# Patient Record
Sex: Female | Born: 1975 | Hispanic: Yes | Marital: Married | Smoking: Never smoker
Health system: Southern US, Community
[De-identification: ages and names within clinical notes are randomized; demographics above are authoritative.]

## PROBLEM LIST (undated history)

## (undated) HISTORY — PX: COSMETIC SURGERY: SHX468

## (undated) HISTORY — PX: REFRACTIVE SURGERY: SHX103

---

## 2004-04-21 HISTORY — PX: REFRACTIVE SURGERY: SHX103

## 2009-04-21 HISTORY — PX: CHOLECYSTECTOMY: SHX55

## 2018-04-21 HISTORY — PX: COSMETIC SURGERY: SHX468

## 2020-11-08 ENCOUNTER — Other Ambulatory Visit: Payer: Self-pay

## 2020-11-08 ENCOUNTER — Ambulatory Visit (LOCAL_COMMUNITY_HEALTH_CENTER): Payer: BLUE CROSS/BLUE SHIELD

## 2020-11-08 DIAGNOSIS — Z23 Encounter for immunization: Secondary | ICD-10-CM

## 2020-11-08 DIAGNOSIS — Z0184 Encounter for antibody response examination: Secondary | ICD-10-CM

## 2020-11-08 NOTE — Progress Notes (Signed)
In Nurse Clinic for vaccines needed for immigration. Pt states she moved from [place] 1 yr ago and resides in [place] but is in [place] for the summer. Presents childhood vaccine record from [place]. Reports hx chickenpox. Pt agrees to have Varicella titer ($23).  Tdap and Twinrix administered today. Pt plans to wait until varicella titer results are ready before having MMR vaccine so that if varicella vaccine is indicated she may have both varicella and MMR vaccines on same day since both are live vaccines. ROI signed and RN plans to call pt with varicella titer results when ready. Pt sent to lab for titer and clerk for payment. Josie Saunders, RN

## 2020-11-10 LAB — VARICELLA ZOSTER ANTIBODY, IGG: Varicella zoster IgG: 2094 index (ref >165)

## 2020-11-12 ENCOUNTER — Ambulatory Visit (LOCAL_COMMUNITY_HEALTH_CENTER): Payer: BLUE CROSS/BLUE SHIELD

## 2020-11-12 ENCOUNTER — Other Ambulatory Visit: Payer: Self-pay

## 2020-11-12 DIAGNOSIS — Z23 Encounter for immunization: Secondary | ICD-10-CM

## 2020-11-12 NOTE — Progress Notes (Signed)
In Nurse Clinic for MMR as needed for immigration. Tolerated  MMR vaccine well today. Copy of Varicella titer results (consistent with immunity) given and explained. [facility] updated and copy given and explained. Josie Saunders, RN

## 2020-11-23 DIAGNOSIS — M545 Low back pain, unspecified: Secondary | ICD-10-CM | POA: Insufficient documentation

## 2020-11-23 HISTORY — DX: Low back pain, unspecified: M54.50

## 2020-11-25 ENCOUNTER — Other Ambulatory Visit: Payer: Self-pay

## 2020-11-25 ENCOUNTER — Emergency Department: Payer: BLUE CROSS/BLUE SHIELD

## 2020-11-25 ENCOUNTER — Emergency Department
Admission: EM | Admit: 2020-11-25 | Discharge: 2020-11-25 | Disposition: A | Discharge status: Home / Self Care | Payer: BLUE CROSS/BLUE SHIELD | Attending: Emergency Medicine | Admitting: Emergency Medicine

## 2020-11-25 ENCOUNTER — Encounter: Payer: Self-pay | Admitting: Emergency Medicine

## 2020-11-25 DIAGNOSIS — M545 Low back pain, unspecified: Secondary | ICD-10-CM | POA: Diagnosis not present

## 2020-11-25 LAB — POC URINE PREG, ED: Preg Test, Ur: NEGATIVE

## 2020-11-25 MED ORDER — CYCLOBENZAPRINE HCL 5 MG PO TABS: 5.0000 mg | ORAL_TABLET | Freq: Three times a day (TID) | ORAL | 0 refills | Status: DC | PRN

## 2020-11-25 MED ORDER — NAPROXEN 500 MG PO TABS
500.0000 mg | ORAL_TABLET | Freq: Once | ORAL | Status: AC
  Administered 2020-11-25: 500 mg via ORAL
  Filled 2020-11-25: qty 1

## 2020-11-25 MED ORDER — NAPROXEN 375 MG PO TABS: 375.0000 mg | ORAL_TABLET | Freq: Two times a day (BID) | ORAL | 0 refills | Status: DC

## 2020-11-25 MED ORDER — CYCLOBENZAPRINE HCL 10 MG PO TABS
5.0000 mg | ORAL_TABLET | Freq: Once | ORAL | Status: AC
  Administered 2020-11-25: 5 mg via ORAL
  Filled 2020-11-25: qty 1

## 2020-11-25 NOTE — ED Notes (Signed)
Pt ambulatory to restroom with steady gait to obtain urine specimen.

## 2020-11-25 NOTE — ED Notes (Signed)
Pt to ED POV c/o R sided lower back pain. Pt said it started 2 days ago and has gotten worse. Denies abdominal pain, N&V, urinary symptoms,and fever. Pt had one episode of diarrhea.   A&O x4, NAD at this time

## 2020-11-25 NOTE — Discharge Instructions (Addendum)
You were seen today for acute low back pain.  Your x-ray did not show any acute findings.  You likely have a lumbar strain.  I have given you prescription for anti-inflammatories to take twice daily with food.  I have given you prescription for muscle relaxers to take every 8 hours as needed for muscle tension, please be aware that these may cause sedation.  You may alternate heat and ice OTC.  I have given you back exercises to try.  You may follow-up with a chiropractor if symptoms persist or worsen.

## 2020-11-25 NOTE — ED Triage Notes (Signed)
Pt reports lower back pain that started last pm and radiates through her right buttocks and down her right leg

## 2020-11-25 NOTE — ED Provider Notes (Signed)
[facility] Emergency Department Provider Note ____________________________________________  Time seen: 1930  I have reviewed the triage vital signs and the nursing notes.  HISTORY  Chief Complaint  Back Pain   HPI Alexis Hardin is a 45 y.o. female presents to the ER today with complaint of low back pain.  She reports this started 2 days ago.  She describes the pain as sharp and stabbing.  The pain radiates into her right buttock.  She denies pain that radiates down her right leg.  She denies loss of bowel or bladder control.  She denies numbness, tingling or weakness of her right lower extremity.  She denies nausea, vomiting pain, reflux, constipation, diarrhea or blood in her stool.  She denies urinary urgency, frequency, dysuria, blood in her urine or vaginal complaints at this time.  She has taken Ibuprofen and Tylenol OTC with minimal relief of symptoms.  She denies any injury to the area.  History reviewed. No pertinent past medical history.  There are no problems to display for this patient.   History reviewed. No pertinent surgical history.  Prior to Admission medications   Medication Sig Start Date End Date Taking? Authorizing Provider  cyclobenzaprine (FLEXERIL) 5 MG tablet Take 1 tablet (5 mg total) by mouth 3 (three) times daily as needed for muscle spasms. 11/25/20  Yes Lorre Munroe, NP  naproxen (NAPROSYN) 375 MG tablet Take 1 tablet (375 mg total) by mouth 2 (two) times daily with a meal. 11/25/20  Yes Tkeya Stencil, Salvadore Oxford, NP    Allergies Patient has no known allergies.  No family history on file.  Social History    Review of Systems  Constitutional: Negative for fever, chills or body aches. Cardiovascular: Negative for chest pain or chest tightness. Respiratory: Negative for cough or shortness of breath. Gastrointestinal: Negative for abdominal pain, nausea, vomiting, constipation, diarrhea or blood in her stool. Genitourinary:  Negative for urinary urgency, frequency, dysuria, blood in her urine or vaginal complaints.. Musculoskeletal: Positive for low back pain. Skin: Negative for rash. Neurological: Negative for focal weakness, tingling or numbness. ____________________________________________  PHYSICAL EXAM:  VITAL SIGNS: ED Triage Vitals  Enc Vitals Group     BP 11/25/20 1755 92/76     Pulse Rate 11/25/20 1755 66     Resp 11/25/20 1755 18     Temp 11/25/20 1755 98.4 F (36.9 C)     Temp Source 11/25/20 1755 Oral     SpO2 11/25/20 1755 100 %     Weight 11/25/20 1745 120 lb (54.4 kg)     Height 11/25/20 1745 5' (1.524 m)     Head Circumference --      Peak Flow --      Pain Score 11/25/20 1745 9     Pain Loc --      Pain Edu? --      Excl. in GC? --     Constitutional: Alert and oriented. Well appearing and in no distress. Head: Normocephalic. Eyes: Normal extraocular movements Cardiovascular: Normal rate, regular rhythm.  Respiratory: Normal respiratory effort. No wheezes/rales/rhonchi. Gastrointestinal: No CVA tenderness noted. Musculoskeletal: Normal flexion, extension, rotation and lateral bending of the spine.  No bony tenderness noted over the lumbar spine.  Pain with palpation of the right paralumbar muscles.  Strength 5/5 BLE.  Able to stand on tiptoes and heels.  Gait slow and steady without device. Neurologic:  Normal gait without ataxia. Normal speech and language. No gross focal neurologic deficits are appreciated.  Skin:  Skin is warm, dry and intact. No rash noted.  ____________________________________________   LABS Labs Reviewed  POC URINE PREG, ED   ____________________________________________   RADIOLOGY Imaging Orders  DG Lumbar Spine 2-3 Views  IMPRESSION: Mild dextrocurvature.  Otherwise negative  ____________________________________________   INITIAL IMPRESSION / ASSESSMENT AND PLAN / ED COURSE  Acute Low Back Pain:  DDx include lumbar strain, acute low back  pain with right sided sciatica, osteoarthritis Xray lumbar spine does not show any acute findings Naproxen 500 mg PO x 1 Flexeril 5 mg PO x 1 RX for Naproxen 375 mg BID x 7 days RX for Flexeril 5 mg TID prn- sedation caution given She plans to follow up with an orthopedist as an outpatient   ___________________________________  FINAL CLINICAL IMPRESSION(S) / ED DIAGNOSES  Final diagnoses:  Acute right-sided low back pain without sciatica      Lorre Munroe, NP 11/25/20 2028    Merwyn Katos, MD 11/25/20 2252

## 2021-04-21 DIAGNOSIS — K589 Irritable bowel syndrome without diarrhea: Secondary | ICD-10-CM

## 2021-04-21 HISTORY — DX: Irritable bowel syndrome, unspecified: K58.9

## 2021-10-26 DIAGNOSIS — H109 Unspecified conjunctivitis: Secondary | ICD-10-CM

## 2021-10-26 HISTORY — DX: Unspecified conjunctivitis: H10.9

## 2021-10-28 ENCOUNTER — Ambulatory Visit
Admission: RE | Admit: 2021-10-28 | Discharge: 2021-10-28 | Disposition: A | Discharge status: Home / Self Care | Payer: No Typology Code available for payment source | Source: Ambulatory Visit | Attending: Emergency Medicine | Admitting: Emergency Medicine

## 2021-10-28 VITALS — BP 103/67 | HR 72 | Temp 98.0°F | Resp 18

## 2021-10-28 DIAGNOSIS — H109 Unspecified conjunctivitis: Secondary | ICD-10-CM | POA: Diagnosis not present

## 2021-10-28 DIAGNOSIS — H00015 Hordeolum externum left lower eyelid: Secondary | ICD-10-CM | POA: Diagnosis not present

## 2021-10-28 MED ORDER — POLYMYXIN B-TRIMETHOPRIM 10000-0.1 UNIT/ML-% OP SOLN
1.0000 [drp] | Freq: Four times a day (QID) | OPHTHALMIC | 0 refills | Status: AC
Start: 2021-10-28 — End: 2021-11-04

## 2021-10-28 NOTE — ED Triage Notes (Signed)
Patient presents to Urgent Care with complaints of eye itchiness and drainage since Saturday. Pt states she is concerned with eye infection. Not treating with meds.

## 2021-10-28 NOTE — Discharge Instructions (Addendum)
Use the antibiotic eyedrops as prescribed.    Follow-up with your primary care provider if your symptoms are not improving.    Go to the emergency department if you have acute eye pain, changes in your vision, or other concerning symptoms.    

## 2021-10-28 NOTE — ED Provider Notes (Signed)
[facility]    CSN: [id number] Arrival date & time: 10/28/21  1907      History   Chief Complaint Chief Complaint  Patient presents with   Eye Problem    Entered by patient    HPI Alexis Hardin is a 46 y.o. female.  Patient presents with left lower eyelid "bump" that is tender and red x2 days.  She also reports bilateral eye itching, redness, crusting in her lashes, drainage.  No treatments at home.  She denies eye injury, eye pain, changes in vision.  She denies fever, chills, sore throat, cough, shortness of breath, vomiting, diarrhea, or other symptoms.  No pertinent medical history.  The history is provided by the patient.    History reviewed. No pertinent past medical history.  There are no problems to display for this patient.   Past Surgical History:  Procedure Laterality Date   REFRACTIVE SURGERY      OB History   No obstetric history on file.      Home Medications    Prior to Admission medications   Medication Sig Start Date End Date Taking? Authorizing Provider  trimethoprim-polymyxin b (POLYTRIM) ophthalmic solution Place 1 drop into both eyes 4 (four) times daily for 7 days. 10/28/21 11/04/21 Yes Mickie Bail, NP  cyclobenzaprine (FLEXERIL) 5 MG tablet Take 1 tablet (5 mg total) by mouth 3 (three) times daily as needed for muscle spasms. 11/25/20   Lorre Munroe, NP  naproxen (NAPROSYN) 375 MG tablet Take 1 tablet (375 mg total) by mouth 2 (two) times daily with a meal. 11/25/20   Baity, Salvadore Oxford, NP    Family History History reviewed. No pertinent family history.  Social History Social History   Tobacco Use   Smoking status: Never   Smokeless tobacco: Never  Vaping Use   Vaping Use: Never used  Substance Use Topics   Alcohol use: Never   Drug use: Never     Allergies   Patient has no known allergies.   Review of Systems Review of Systems  Constitutional:  Negative for chills and fever.  HENT:  Negative for ear  pain and sore throat.   Eyes:  Positive for discharge, redness and itching. Negative for pain and visual disturbance.  Respiratory:  Negative for cough and shortness of breath.   Gastrointestinal:  Negative for diarrhea and vomiting.  Skin:  Negative for color change and rash.  All other systems reviewed and are negative.    Physical Exam Triage Vital Signs ED Triage Vitals  Enc Vitals Group     BP      Pulse      Resp      Temp      Temp src      SpO2      Weight      Height      Head Circumference      Peak Flow      Pain Score      Pain Loc      Pain Edu?      Excl. in GC?    No data found.  Updated Vital Signs BP 103/67   Pulse 72   Temp 98 F (36.7 C)   Resp 18   SpO2 98%   Visual Acuity Right Eye Distance:   Left Eye Distance:   Bilateral Distance:    Right Eye Near:   Left Eye Near:    Bilateral Near:     Physical  Exam Vitals and nursing note reviewed.  Constitutional:      General: She is not in acute distress.    Appearance: She is well-developed. She is not ill-appearing.  HENT:     Mouth/Throat:     Mouth: Mucous membranes are moist.     Pharynx: Oropharynx is clear.  Eyes:     General: Vision grossly intact.        Right eye: No discharge.        Left eye: No discharge.     Extraocular Movements: Extraocular movements intact.     Conjunctiva/sclera:     Right eye: Right conjunctiva is injected.     Left eye: Left conjunctiva is injected.     Pupils: Pupils are equal, round, and reactive to light.   Cardiovascular:     Rate and Rhythm: Normal rate and regular rhythm.     Heart sounds: Normal heart sounds.  Pulmonary:     Effort: Pulmonary effort is normal. No respiratory distress.     Breath sounds: Normal breath sounds.  Musculoskeletal:     Cervical back: Neck supple.  Skin:    General: Skin is warm and dry.  Neurological:     Mental Status: She is alert.  Psychiatric:        Mood and Affect: Mood normal.        Behavior:  Behavior normal.      UC Treatments / Results  Labs (all labs ordered are listed, but only abnormal results are displayed) Labs Reviewed - No data to display  EKG   Radiology No results found.  Procedures Procedures (including critical care time)  Medications Ordered in UC Medications - No data to display  Initial Impression / Assessment and Plan / UC Course  I have reviewed the triage vital signs and the nursing notes.  Pertinent labs & imaging results that were available during my care of the patient were reviewed by me and considered in my medical decision making (see chart for details).  Conjunctivitis, hordeolum of left lower eyelid.  Discussed symptomatic treatment including warm compresses.  Also treating with Polytrim eyedrops.  ED precautions discussed.  Instructed patient to follow-up with her PCP if her symptoms are not improving.  Education provided on stye.  Patient agrees to plan of care.   Final Clinical Impressions(s) / UC Diagnoses   Final diagnoses:  Conjunctivitis of left eye, unspecified conjunctivitis type  Hordeolum externum of left lower eyelid     Discharge Instructions      Use the antibiotic eyedrops as prescribed.    Follow-up with your primary care provider if your symptoms are not improving.    Go to the emergency department if you have acute eye pain, changes in your vision, or other concerning symptoms.        ED Prescriptions     Medication Sig Dispense Auth. Provider   trimethoprim-polymyxin b (POLYTRIM) ophthalmic solution Place 1 drop into both eyes 4 (four) times daily for 7 days. 10 mL Mickie Bail, NP      PDMP not reviewed this encounter.   Mickie Bail, NP 10/28/21 1947

## 2022-05-02 ENCOUNTER — Ambulatory Visit: Payer: Self-pay

## 2022-05-02 DIAGNOSIS — N390 Urinary tract infection, site not specified: Secondary | ICD-10-CM

## 2022-05-02 HISTORY — DX: Urinary tract infection, site not specified: N39.0

## 2022-05-05 ENCOUNTER — Ambulatory Visit
Admission: EM | Admit: 2022-05-05 | Discharge: 2022-05-05 | Disposition: A | Discharge status: Home / Self Care | Payer: BC Managed Care – PPO | Attending: Physician Assistant | Admitting: Physician Assistant

## 2022-05-05 ENCOUNTER — Other Ambulatory Visit: Payer: Self-pay

## 2022-05-05 DIAGNOSIS — R82998 Other abnormal findings in urine: Secondary | ICD-10-CM | POA: Diagnosis present

## 2022-05-05 LAB — URINALYSIS, ROUTINE W REFLEX MICROSCOPIC
Bilirubin Urine: NEGATIVE
Glucose, UA: NEGATIVE mg/dL
Hgb urine dipstick: NEGATIVE
Ketones, ur: NEGATIVE mg/dL
Nitrite: NEGATIVE
Protein, ur: NEGATIVE mg/dL
Specific Gravity, Urine: 1.025 (ref 1.005–1.030)
pH: 5.5 (ref 5.0–8.0)

## 2022-05-05 LAB — URINALYSIS, MICROSCOPIC (REFLEX)

## 2022-05-05 NOTE — ED Triage Notes (Signed)
Pt states she was having UTI sx last week and used telehealth.  She is taking Metronidazole and Diflucan. Her symptoms are better except she is concerned about the color of her urine. States it is very dark.

## 2022-05-05 NOTE — ED Provider Notes (Signed)
[facility]    CSN: [id number] Arrival date & time: 05/05/22  1259      History   Chief Complaint No chief complaint on file.   HPI Alexis Hardin is a 47 y.o. female.   Patient presents for evaluation of darkened urine, more urgent color than baseline present for the last 7 days.  Currently taking metronidazole and Diflucan for vaginal symptoms which have resolved, prescribed by e-visit.  Denies all urinary symptoms, abdominal pain, flank pain, fevers.  Endorses keto diet.  Endorses adequate fluid intake through primarily water.     History reviewed. No pertinent past medical history.  There are no problems to display for this patient.   Past Surgical History:  Procedure Laterality Date   REFRACTIVE SURGERY      OB History   No obstetric history on file.      Home Medications    Prior to Admission medications   Medication Sig Start Date End Date Taking? Authorizing Provider  fluconazole (DIFLUCAN) 150 MG tablet Take 150 mg by mouth every 3 (three) days. 05/02/22  Yes [provider]  metroNIDAZOLE (FLAGYL) 500 MG tablet Take 500 mg by mouth 2 (two) times daily. 05/02/22  Yes [provider]  cyclobenzaprine (FLEXERIL) 5 MG tablet Take 1 tablet (5 mg total) by mouth 3 (three) times daily as needed for muscle spasms. 11/25/20   Jearld Fenton, NP  naproxen (NAPROSYN) 375 MG tablet Take 1 tablet (375 mg total) by mouth 2 (two) times daily with a meal. 11/25/20   Baity, Coralie Keens, NP    Family History History reviewed. No pertinent family history.  Social History Social History   Tobacco Use   Smoking status: Never   Smokeless tobacco: Never  Vaping Use   Vaping Use: Never used  Substance Use Topics   Alcohol use: Never   Drug use: Never     Allergies   Patient has no known allergies.   Review of Systems Review of Systems  Constitutional: Negative.   Respiratory: Negative.    Cardiovascular: Negative.    Genitourinary: Negative.   Skin: Negative.   Neurological: Negative.      Physical Exam Triage Vital Signs ED Triage Vitals  Enc Vitals Group     BP 05/05/22 1329 (!) 107/56     Pulse Rate 05/05/22 1329 73     Resp 05/05/22 1329 16     Temp 05/05/22 1329 98 F (36.7 C)     Temp Source 05/05/22 1329 Oral     SpO2 05/05/22 1329 100 %     Weight 05/05/22 1325 125 lb 3.2 oz (56.8 kg)     Height 05/05/22 1325 5' (1.524 m)     Head Circumference --      Peak Flow --      Pain Score 05/05/22 1325 0     Pain Loc --      Pain Edu? --      Excl. in [place]? --    No data found.  Updated Vital Signs BP (!) 107/56 (BP Location: Left Arm)   Pulse 73   Temp 98 F (36.7 C) (Oral)   Resp 16   Ht 5' (1.524 m)   Wt 125 lb 3.2 oz (56.8 kg)   LMP 04/17/2022   SpO2 100%   BMI 24.45 kg/m   Visual Acuity Right Eye Distance:   Left Eye Distance:   Bilateral Distance:    Right Eye Near:   Left  Eye Near:    Bilateral Near:     Physical Exam Constitutional:      Appearance: Normal appearance.  HENT:     Head: Normocephalic.  Eyes:     Extraocular Movements: Extraocular movements intact.  Pulmonary:     Effort: Pulmonary effort is normal.  Abdominal:     General: Abdomen is flat. Bowel sounds are normal. There is no distension.     Palpations: Abdomen is soft.     Tenderness: There is no abdominal tenderness. There is no right CVA tenderness, left CVA tenderness or guarding.  Skin:    General: Skin is warm and dry.  Neurological:     Mental Status: She is alert and oriented to person, place, and time. Mental status is at baseline.      UC Treatments / Results  Labs (all labs ordered are listed, but only abnormal results are displayed) Labs Reviewed  URINALYSIS, [place] MICROSCOPIC    EKG   Radiology No results found.  Procedures Procedures (including critical care time)  Medications Ordered in UC Medications - No data to display  Initial Impression  / Assessment and Plan / UC Course  I have reviewed the triage vital signs and the nursing notes.  Pertinent labs & imaging results that were available during my care of the patient were reviewed by me and considered in my medical decision making (see chart for details).  Dark urine  Urinalysis negative, sent for culture as patient is currently taking antibiotic, yeast is present and discussed administration of remaining Diflucan tablets to help clear symptoms, advised to continue increase fluid intake and good hygiene measures, advised to monitor closely and if new urinary symptoms begin to return for reevaluation  Final Clinical Impressions(s) / UC Diagnoses   Final diagnoses:  None   Discharge Instructions   None    ED Prescriptions   None    PDMP not reviewed this encounter.   Hans Eden, NP 05/05/22 1443

## 2022-05-05 NOTE — Discharge Instructions (Addendum)
urinalysis today is negative for infection, it has been sent to culture to determine if bacteria is present in his urine currently taking antibiotic  Able to see yeast in your urine , second Diflucan tablet after completion of your antibiotics  Please continue to increase fluid intake through use of water  If you begin to have urinary symptoms such as frequency, urgency, pain with urination, lower abdominal pain, new back pain please return for reevaluation

## 2022-05-06 ENCOUNTER — Ambulatory Visit: Payer: Self-pay

## 2022-05-06 LAB — URINE CULTURE: Culture: NO GROWTH

## 2022-09-02 ENCOUNTER — Encounter: Payer: Self-pay | Admitting: Nurse Practitioner

## 2022-09-02 ENCOUNTER — Ambulatory Visit (INDEPENDENT_AMBULATORY_CARE_PROVIDER_SITE_OTHER): Payer: BC Managed Care – PPO | Admitting: Nurse Practitioner

## 2022-09-02 VITALS — BP 130/74 | HR 86 | Temp 98.1°F | Resp 16 | Ht 60.0 in | Wt 122.2 lb

## 2022-09-02 DIAGNOSIS — D649 Anemia, unspecified: Secondary | ICD-10-CM | POA: Diagnosis not present

## 2022-09-02 DIAGNOSIS — M533 Sacrococcygeal disorders, not elsewhere classified: Secondary | ICD-10-CM

## 2022-09-02 DIAGNOSIS — L659 Nonscarring hair loss, unspecified: Secondary | ICD-10-CM

## 2022-09-02 DIAGNOSIS — M7918 Myalgia, other site: Secondary | ICD-10-CM

## 2022-09-02 DIAGNOSIS — Z1231 Encounter for screening mammogram for malignant neoplasm of breast: Secondary | ICD-10-CM

## 2022-09-02 DIAGNOSIS — E538 Deficiency of other specified B group vitamins: Secondary | ICD-10-CM | POA: Diagnosis not present

## 2022-09-02 DIAGNOSIS — Z1211 Encounter for screening for malignant neoplasm of colon: Secondary | ICD-10-CM

## 2022-09-02 DIAGNOSIS — E559 Vitamin D deficiency, unspecified: Secondary | ICD-10-CM | POA: Diagnosis not present

## 2022-09-02 DIAGNOSIS — Z0000 Encounter for general adult medical examination without abnormal findings: Secondary | ICD-10-CM

## 2022-09-02 DIAGNOSIS — Z1212 Encounter for screening for malignant neoplasm of rectum: Secondary | ICD-10-CM

## 2022-09-02 NOTE — Progress Notes (Signed)
[facility] [address] [place], [place] [address]  Internal MEDICINE  Office Visit Note  Patient Name: Alexis Hardin  [id number]  [id number]  Date of Service: 09/02/2022   Complaints/HPI Pt is here for establishment of PCP. Chief Complaint  Patient presents with   New Patient (Initial Visit)    General check up/ labs    HPI Neida presents for a new patient visit to establish care.  Well-appearing 47 y.o. female with no major medical conditions. She is not on any medications. 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  Diet: healthy, [employer], weight and BMI are normal .  Exercise: not regularly  Tobacco use: none Alcohol use: none Illicit drug use: none Routine CRC screening: due now, opt for cologuard  Routine mammogram: due now  Pap smear: due now  Labs: due for routine labs  New or worsening pain: intermittent lower back pain -- not sure what from. Wants to get further xrays    Current Medication: Outpatient Encounter Medications as of 09/02/2022  Medication Sig   cyclobenzaprine (FLEXERIL) 5 MG tablet Take 1 tablet (5 mg total) by mouth 3 (three) times daily as needed for muscle spasms. (Patient not taking: Reported on 09/02/2022)   fluconazole (DIFLUCAN) 150 MG tablet Take 150 mg by mouth every 3 (three) days. (Patient not taking: Reported on 09/02/2022)   metroNIDAZOLE (FLAGYL) 500 MG tablet Take 500 mg by mouth 2 (two) times daily. (Patient not taking: Reported on 09/02/2022)   naproxen (NAPROSYN) 375 MG tablet Take 1 tablet (375 mg total) by mouth 2 (two) times daily with a meal. (Patient not taking: Reported on 09/02/2022)   No facility-administered encounter medications on file as of 09/02/2022.    Surgical History: Past Surgical History:  Procedure Laterality Date   COSMETIC SURGERY     REFRACTIVE SURGERY      Medical History: No past medical history on  file.  Family History: No family history on file.  Social History   Socioeconomic History   Marital status: Married    Spouse name: Not on file   Number of children: Not on file   Years of education: Not on file   Highest education level: Not on file  Occupational History   Not on file  Tobacco Use   Smoking status: Never   Smokeless tobacco: Never  Vaping Use   Vaping Use: Never used  Substance and Sexual Activity   Alcohol use: Never   Drug use: Never   Sexual activity: Not Currently  Other Topics Concern   Not on file  Social History Narrative   Not on file   Social Determinants of Health   Financial Resource Strain: Not on file  Food Insecurity: Not on file  Transportation Needs: Not on file  Physical Activity: Not on file  Stress: Not on file  Social Connections: Not on file  Intimate Partner Violence: Not on file     Review of Systems  Constitutional:  Negative for chills, fatigue and unexpected weight change.  HENT:  Negative for congestion, postnasal drip, rhinorrhea, sneezing and sore throat.   Respiratory: Negative.  Negative for cough, chest tightness and shortness of breath.   Cardiovascular: Negative.  Negative for chest pain and palpitations.  Gastrointestinal:  Negative for abdominal pain, constipation, diarrhea, nausea and vomiting.  Musculoskeletal:  Negative for arthralgias, back pain, joint swelling and neck pain.  Skin:  Negative for  rash.  Psychiatric/Behavioral:  Negative for behavioral problems (Depression), sleep disturbance and suicidal ideas. The patient is not nervous/anxious.     Vital Signs: BP 130/74   Pulse 86   Temp 98.1 F (36.7 C)   Resp 16   Ht 5' (1.524 m)   Wt 122 lb 3.2 oz (55.4 kg)   SpO2 99%   BMI 23.87 kg/m    Physical Exam Vitals reviewed.  Constitutional:      General: She is not in acute distress.    Appearance: Normal appearance. She is normal weight. She is not ill-appearing.  HENT:     Head:  Normocephalic and atraumatic.  Eyes:     Pupils: Pupils are equal, round, and reactive to light.  Cardiovascular:     Rate and Rhythm: Normal rate and regular rhythm.  Pulmonary:     Effort: Pulmonary effort is normal. No respiratory distress.  Neurological:     Mental Status: She is alert and oriented to person, place, and time.  Psychiatric:        Mood and Affect: Mood normal.        Behavior: Behavior normal.       Assessment/Plan: 1. Hair loss disorder Routine labs ordered - CBC with Differential/Platelet - CMP14+EGFR - Vitamin D (25 hydroxy) - B12 and Folate Panel - Iron, TIBC and Ferritin Panel - TSH + free T4  2. Anemia, unspecified type Routine labs ordered - CBC with Differential/Platelet - B12 and Folate Panel - Iron, TIBC and Ferritin Panel - TSH + free T4  3. B12 deficiency Routine labs ordered - CBC with Differential/Platelet - B12 and Folate Panel - Iron, TIBC and Ferritin Panel  4. Vitamin D deficiency Routine labs ordered - Vitamin D (25 hydroxy)  5. Pain of muscle of pelvis Xray ordered for further evaluation - DG Pelvis 1-2 Views; Future  6. Pain in the coccyx Xray ordered for further evaluation  - DG Pelvis 1-2 Views; Future  7. Routine health maintenance Routine labs ordered - CBC with Differential/Platelet - CMP14+EGFR - Lipid Profile - Vitamin D (25 hydroxy) - B12 and Folate Panel - Iron, TIBC and Ferritin Panel - TSH + free T4  8. Screening for colorectal cancer Cologuard test ordered - Cologuard  9. Encounter for screening mammogram for malignant neoplasm of breast Routine mammogram ordered - MM 3D SCREENING MAMMOGRAM BILATERAL BREAST; Future     General Counseling: Janeice verbalizes understanding of the findings of todays visit and agrees with plan of treatment. I have discussed any further diagnostic evaluation that may be needed or ordered today. We also reviewed her medications today. she has been encouraged to call  the office with any questions or concerns that should arise related to todays visit.    Orders Placed This Encounter  Procedures   MM 3D SCREENING MAMMOGRAM BILATERAL BREAST   DG Pelvis 1-2 Views   Cologuard   CBC with Differential/Platelet   CMP14+EGFR   Lipid Profile   Vitamin D (25 hydroxy)   B12 and Folate Panel   Iron, TIBC and Ferritin Panel   TSH + free T4    No orders of the defined types were placed in this encounter.   Return for CPE/PAP, Giavonni Fonder PCP at earliest available opening, have labs done prior to visit. .  Time spent:30 Minutes Time spent with patient included reviewing progress notes, labs, imaging studies, and discussing plan for follow up.   [place] Controlled Substance Database was reviewed by me for overdose risk score (  ORS)   This patient was seen by Jonetta Osgood, FNP-C in collaboration with Dr. Clayborn Bigness as a part of collaborative care agreement.   Emmalise Huard R. Valetta Fuller, MSN, FNP-C Internal Medicine

## 2022-09-19 ENCOUNTER — Ambulatory Visit
Admission: RE | Admit: 2022-09-19 | Discharge: 2022-09-19 | Disposition: A | Discharge status: Home / Self Care | Payer: BC Managed Care – PPO | Source: Ambulatory Visit | Attending: Nurse Practitioner | Admitting: Nurse Practitioner

## 2022-09-19 DIAGNOSIS — M7918 Myalgia, other site: Secondary | ICD-10-CM | POA: Diagnosis present

## 2022-09-19 DIAGNOSIS — M533 Sacrococcygeal disorders, not elsewhere classified: Secondary | ICD-10-CM | POA: Diagnosis present

## 2022-09-20 LAB — CMP14+EGFR
ALT: 12 IU/L (ref 0–32)
AST: 14 IU/L (ref 0–40)
Albumin/Globulin Ratio: 1.6 (ref 1.2–2.2)
Albumin: 4.3 g/dL (ref 3.9–4.9)
Alkaline Phosphatase: 55 IU/L (ref 44–121)
BUN/Creatinine Ratio: 22 (ref 9–23)
BUN: 15 mg/dL (ref 6–24)
Bilirubin Total: 0.4 mg/dL (ref 0.0–1.2)
CO2: 20 mmol/L (ref 20–29)
Calcium: 9 mg/dL (ref 8.7–10.2)
Chloride: 105 mmol/L (ref 96–106)
Creatinine, Ser: 0.69 mg/dL (ref 0.57–1.00)
Globulin, Total: 2.7 g/dL (ref 1.5–4.5)
Glucose: 82 mg/dL (ref 70–99)
Potassium: 4.2 mmol/L (ref 3.5–5.2)
Sodium: 138 mmol/L (ref 134–144)
Total Protein: 7 g/dL (ref 6.0–8.5)
eGFR: 109 mL/min/{1.73_m2} (ref >59)

## 2022-09-20 LAB — CBC WITH DIFFERENTIAL/PLATELET
Basophils Absolute: 0.1 10*3/uL (ref 0.0–0.2)
Basos: 1 %
EOS (ABSOLUTE): 0.1 10*3/uL (ref 0.0–0.4)
Eos: 1 %
Hematocrit: 40.3 % (ref 34.0–46.6)
Hemoglobin: 12.4 g/dL (ref 11.1–15.9)
Immature Grans (Abs): 0 10*3/uL (ref 0.0–0.1)
Immature Granulocytes: 0 %
Lymphocytes Absolute: 1.9 10*3/uL (ref 0.7–3.1)
Lymphs: 30 %
MCH: 24.4 pg — ABNORMAL LOW (ref 26.6–33.0)
MCHC: 30.8 g/dL — ABNORMAL LOW (ref 31.5–35.7)
MCV: 79 fL (ref 79–97)
Monocytes Absolute: 0.4 10*3/uL (ref 0.1–0.9)
Monocytes: 7 %
Neutrophils Absolute: 3.9 10*3/uL (ref 1.4–7.0)
Neutrophils: 61 %
Platelets: 231 10*3/uL (ref 150–450)
RBC: 5.08 x10E6/uL (ref 3.77–5.28)
RDW: 14.5 % (ref 11.7–15.4)
WBC: 6.3 10*3/uL (ref 3.4–10.8)

## 2022-09-20 LAB — IRON,TIBC AND FERRITIN PANEL
Ferritin: 8 ng/mL — ABNORMAL LOW (ref 15–150)
Iron Saturation: 8 % — CL (ref 15–55)
Iron: 31 ug/dL (ref 27–159)
Total Iron Binding Capacity: 396 ug/dL (ref 250–450)
UIBC: 365 ug/dL (ref 131–425)

## 2022-09-20 LAB — B12 AND FOLATE PANEL
Folate: 8.5 ng/mL (ref >3.0)
Vitamin B-12: 686 pg/mL (ref 232–1245)

## 2022-09-20 LAB — LIPID PANEL
Chol/HDL Ratio: 2.7 ratio (ref 0.0–4.4)
Cholesterol, Total: 204 mg/dL — ABNORMAL HIGH (ref 100–199)
HDL: 76 mg/dL (ref >39)
LDL Chol Calc (NIH): 110 mg/dL — ABNORMAL HIGH (ref 0–99)
Triglycerides: 104 mg/dL (ref 0–149)
VLDL Cholesterol Cal: 18 mg/dL (ref 5–40)

## 2022-09-20 LAB — VITAMIN D 25 HYDROXY (VIT D DEFICIENCY, FRACTURES): Vit D, 25-Hydroxy: 29.9 ng/mL — ABNORMAL LOW (ref 30.0–100.0)

## 2022-09-20 LAB — TSH+FREE T4
Free T4: 1.09 ng/dL (ref 0.82–1.77)
TSH: 2.77 u[IU]/mL (ref 0.450–4.500)

## 2022-09-25 LAB — COLOGUARD: COLOGUARD: NEGATIVE

## 2022-10-03 IMAGING — CR DG LUMBAR SPINE 2-3V
1 series · 3 of 3 positions shown · non-contrast
Comparison: None.

CLINICAL DATA: Low back pain

EXAM:
LUMBAR SPINE - 2-3 VIEW

[Series 1: dg lumbar spine 2-3 views · 0.14mm/px · 3 of 3 slices shown]
[im 1/3]
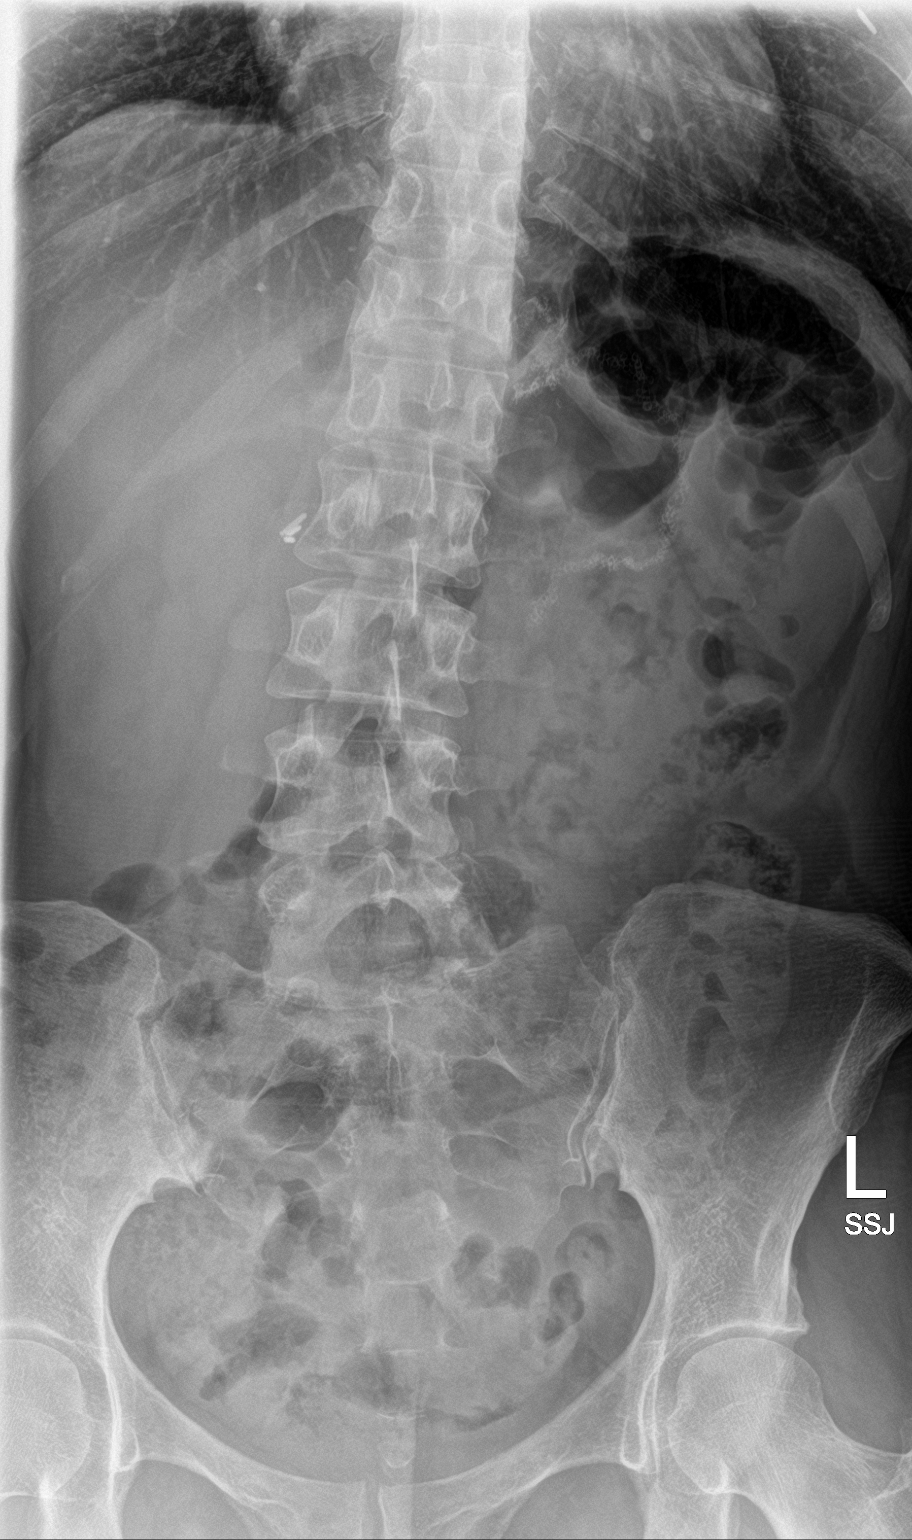
[im 2/3]
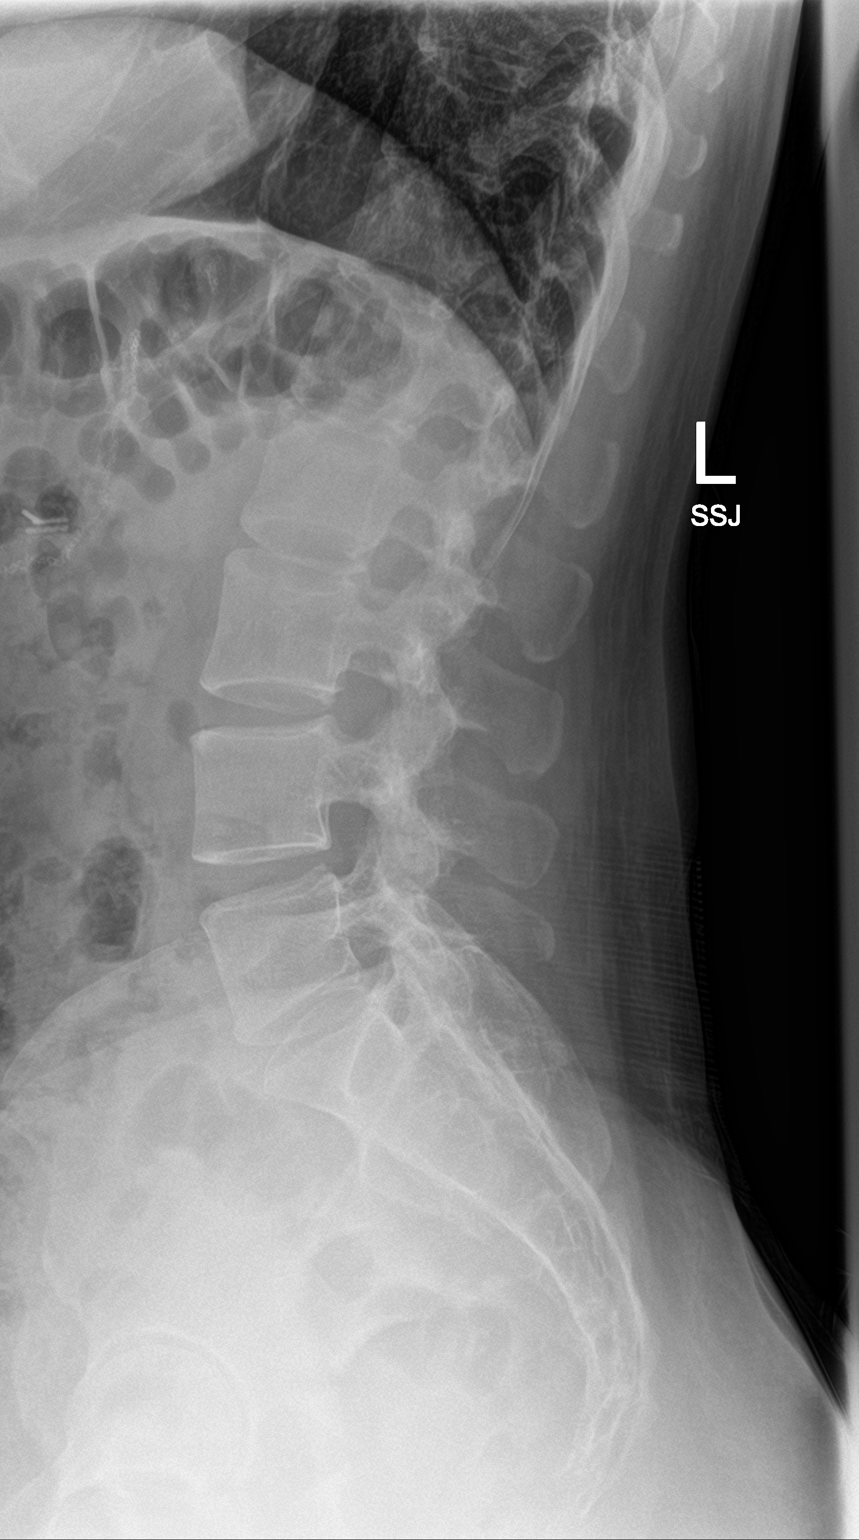
[im 3/3]
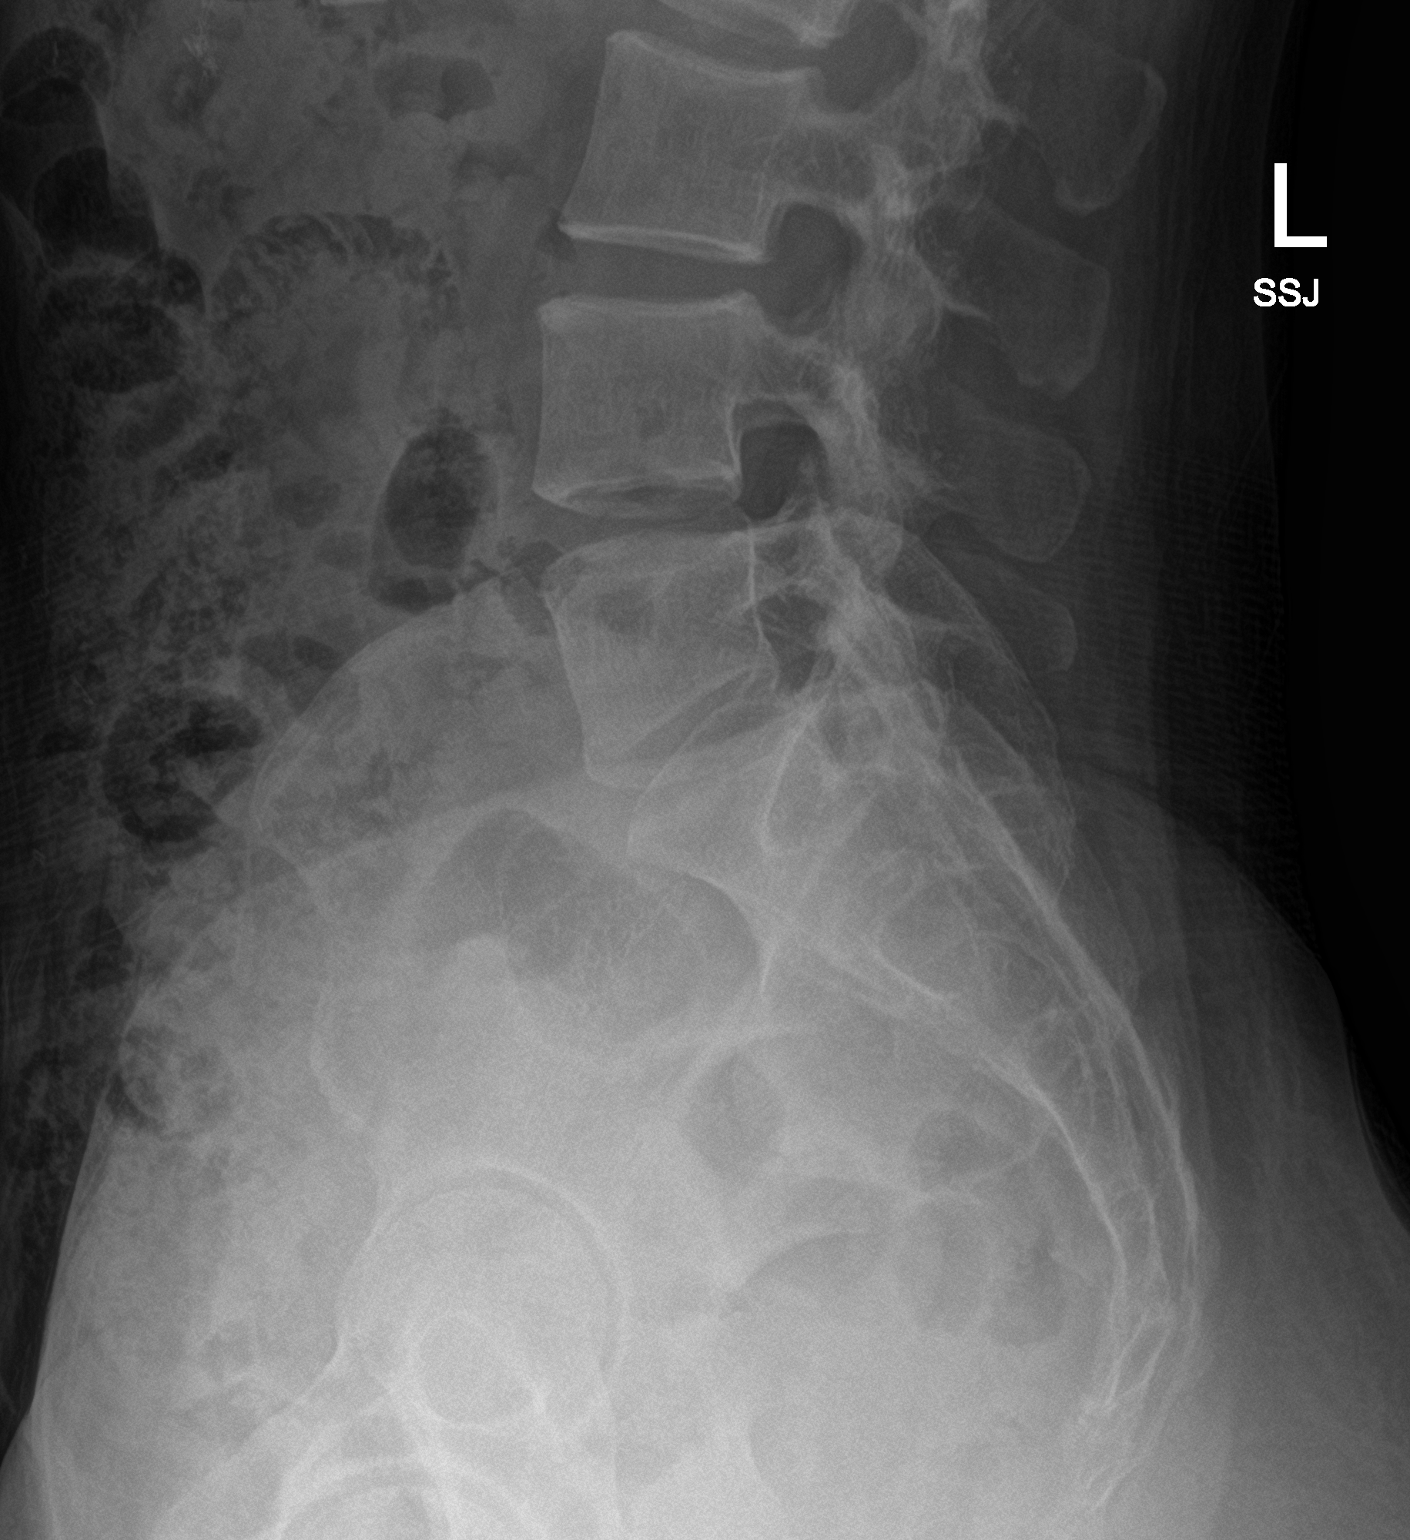

[3 of 3 positions shown; findings below may reference images not displayed]

FINDINGS: Mild dextrocurvature. Sagittal alignment within normal limits.
Vertebral body heights are maintained. Disc spaces appear patent.
IMPRESSION: Mild dextrocurvature.  Otherwise negative

## 2022-10-06 ENCOUNTER — Telehealth: Payer: Self-pay | Admitting: Nurse Practitioner

## 2022-10-06 ENCOUNTER — Ambulatory Visit (INDEPENDENT_AMBULATORY_CARE_PROVIDER_SITE_OTHER): Payer: BC Managed Care – PPO | Admitting: Nurse Practitioner

## 2022-10-06 ENCOUNTER — Encounter: Payer: Self-pay | Admitting: Nurse Practitioner

## 2022-10-06 VITALS — BP 128/72 | HR 73 | Temp 98.7°F | Resp 16 | Ht 60.0 in | Wt 124.2 lb

## 2022-10-06 DIAGNOSIS — E611 Iron deficiency: Secondary | ICD-10-CM | POA: Diagnosis not present

## 2022-10-06 DIAGNOSIS — R300 Dysuria: Secondary | ICD-10-CM | POA: Diagnosis not present

## 2022-10-06 DIAGNOSIS — G8929 Other chronic pain: Secondary | ICD-10-CM

## 2022-10-06 DIAGNOSIS — Z124 Encounter for screening for malignant neoplasm of cervix: Secondary | ICD-10-CM

## 2022-10-06 DIAGNOSIS — M545 Low back pain, unspecified: Secondary | ICD-10-CM

## 2022-10-06 DIAGNOSIS — E559 Vitamin D deficiency, unspecified: Secondary | ICD-10-CM | POA: Diagnosis not present

## 2022-10-06 DIAGNOSIS — Z0001 Encounter for general adult medical examination with abnormal findings: Secondary | ICD-10-CM | POA: Diagnosis not present

## 2022-10-06 DIAGNOSIS — Z1231 Encounter for screening mammogram for malignant neoplasm of breast: Secondary | ICD-10-CM

## 2022-10-06 DIAGNOSIS — D649 Anemia, unspecified: Secondary | ICD-10-CM

## 2022-10-06 HISTORY — DX: Anemia, unspecified: D64.9

## 2022-10-06 MED ORDER — TRAMADOL HCL 50 MG PO TABS: 50.0000 mg | ORAL_TABLET | Freq: Four times a day (QID) | ORAL | 0 refills | Status: AC | PRN

## 2022-10-06 NOTE — Progress Notes (Signed)
[facility] [address] [place], [place] [address]  Internal MEDICINE  Office Visit Note  Patient Name: Alexis Hardin  [id number]  [id number]  Date of Service: 10/06/2022  Chief Complaint  Patient presents with   Annual Exam    HPI Alexis Hardin presents for an annual well visit and physical exam.  Well-appearing 47 y.o. female with no major medical problems.  Routine CRC screening: cologuard was negative.  Routine mammogram: due for mammogram, has implants  DEXA scan: consider due to family history of osteoporosis  Pap smear: done today  Labs: discussed lab results --low ferritin at 8, slightly low vitamin D at 29.9. LDL at 110,  CBC, CMP, B12, folate, thyroid labs are all normal. Cologuard test was negative.  New or worsening pain: none  Other concerns: none    Current Medication: No outpatient encounter medications on file as of 10/06/2022.   No facility-administered encounter medications on file as of 10/06/2022.    Surgical History: Past Surgical History:  Procedure Laterality Date   COSMETIC SURGERY     REFRACTIVE SURGERY      Medical History: History reviewed. No pertinent past medical history.  Family History: History reviewed. No pertinent family history.  Social History   Socioeconomic History   Marital status: Married    Spouse name: Not on file   Number of children: Not on file   Years of education: Not on file   Highest education level: Not on file  Occupational History   Not on file  Tobacco Use   Smoking status: Never   Smokeless tobacco: Never  Vaping Use   Vaping Use: Never used  Substance and Sexual Activity   Alcohol use: Never   Drug use: Never   Sexual activity: Not Currently  Other Topics Concern   Not on file  Social History Narrative   Not on file   Social Determinants of Health   Financial Resource Strain: Not on file  Food Insecurity: Not on file  Transportation Needs: Not on file  Physical  Activity: Not on file  Stress: Not on file  Social Connections: Not on file  Intimate Partner Violence: Not on file      Review of Systems  Constitutional:  Positive for fatigue. Negative for activity change, appetite change, chills, fever and unexpected weight change.  HENT: Negative.  Negative for congestion, ear pain, rhinorrhea, sore throat and trouble swallowing.   Eyes: Negative.   Respiratory: Negative.  Negative for cough, chest tightness, shortness of breath and wheezing.   Cardiovascular: Negative.  Negative for chest pain and palpitations.  Gastrointestinal: Negative.  Negative for abdominal pain, blood in stool, constipation, diarrhea, nausea and vomiting.  Endocrine: Negative.   Genitourinary: Negative.  Negative for difficulty urinating, dysuria, frequency, hematuria and urgency.  Musculoskeletal:  Positive for arthralgias and back pain. Negative for joint swelling, myalgias and neck pain.  Skin: Negative.  Negative for rash and wound.  Allergic/Immunologic: Negative.  Negative for immunocompromised state.  Neurological: Negative.  Negative for dizziness, seizures, numbness and headaches.  Hematological: Negative.   Psychiatric/Behavioral: Negative.  Negative for behavioral problems, self-injury and suicidal ideas. The patient is not nervous/anxious.     Vital Signs: BP 128/72   Pulse 73   Temp 98.7 F (37.1 C)   Resp 16   Ht 5' (1.524 m)   Wt 124 lb 3.2 oz (56.3 kg)   SpO2 99%   BMI 24.26 kg/m    Physical Exam Vitals reviewed.  Constitutional:  General: She is not in acute distress.    Appearance: Normal appearance. She is well-developed and normal weight. She is not ill-appearing or diaphoretic.  HENT:     Head: Normocephalic and atraumatic.     Right Ear: Tympanic membrane, ear canal and external ear normal. There is no impacted cerumen.     Left Ear: Tympanic membrane, ear canal and external ear normal. There is no impacted cerumen.     Nose: Nose  normal. No congestion or rhinorrhea.     Mouth/Throat:     Mouth: Mucous membranes are moist.     Pharynx: Oropharynx is clear. No oropharyngeal exudate or posterior oropharyngeal erythema.  Eyes:     General: No scleral icterus.       Right eye: No discharge.        Left eye: No discharge.     Conjunctiva/sclera: Conjunctivae normal.     Pupils: Pupils are equal, round, and reactive to light.  Neck:     Thyroid: No thyromegaly.     Vascular: No JVD.     Trachea: No tracheal deviation.  Cardiovascular:     Rate and Rhythm: Normal rate and regular rhythm.     Pulses: Normal pulses.     Heart sounds: Normal heart sounds. No murmur heard.    No friction rub. No gallop.  Pulmonary:     Effort: Pulmonary effort is normal. No respiratory distress.     Breath sounds: Normal breath sounds. No stridor. No wheezing or rales.  Chest:     Chest wall: No tenderness.  Abdominal:     General: Bowel sounds are normal. There is no distension.     Palpations: Abdomen is soft. There is no mass.     Tenderness: There is no abdominal tenderness. There is no guarding or rebound.     Hernia: There is no hernia in the left inguinal area or right inguinal area.  Genitourinary:    General: Normal vulva.     Exam position: Lithotomy position.     Pubic Area: No rash or pubic lice.      Labia:        Right: No rash, tenderness, lesion or injury.        Left: No rash, tenderness, lesion or injury.      Urethra: No prolapse, urethral pain, urethral swelling or urethral lesion.     Vagina: Normal. No signs of injury and foreign body. No vaginal discharge, erythema, tenderness, bleeding, lesions or prolapsed vaginal walls.     Cervix: No cervical motion tenderness, discharge, friability, lesion, erythema, cervical bleeding or eversion.     Uterus: Normal. Not deviated, not enlarged, not fixed, not tender and no uterine prolapse.      Adnexa: Right adnexa normal and left adnexa normal.       Right: No mass,  tenderness or fullness.         Left: No mass, tenderness or fullness.       Rectum: Normal. No mass, anal fissure or external hemorrhoid.  Musculoskeletal:        General: No tenderness or deformity. Normal range of motion.     Cervical back: Normal range of motion and neck supple.  Lymphadenopathy:     Cervical: No cervical adenopathy.     Lower Body: No right inguinal adenopathy. No left inguinal adenopathy.  Skin:    General: Skin is warm and dry.     Capillary Refill: Capillary refill takes less than 2 seconds.  Coloration: Skin is not pale.     Findings: No erythema or rash.  Neurological:     Mental Status: She is alert and oriented to person, place, and time.     Cranial Nerves: No cranial nerve deficit.     Motor: No abnormal muscle tone.     Coordination: Coordination normal.     Deep Tendon Reflexes: Reflexes are normal and symmetric.  Psychiatric:        Mood and Affect: Mood normal.        Behavior: Behavior normal.        Thought Content: Thought content normal.        Judgment: Judgment normal.        Assessment/Plan: 1. Encounter for routine adult health examination with abnormal findings Age-appropriate preventive screenings and vaccinations discussed, annual physical exam completed. Routine labs for health maintenance results discussed with patient today. Marland Kitchen PHM updated.   2. Chronic bilateral low back pain without sciatica Lumbar xray ordered as well as orthopedic referral  - DG Lumbar Spine Complete; Future - Ambulatory referral to Orthopedic Surgery  3. Dietary iron deficiency without anemia Encouraged patient to take OTC ferrous fumarate at least 3 times weekly   4. Vitamin D deficiency Slightly low level, take OTC supplement   5. Dysuria Routine urinalysis done  - UA/M w/rflx Culture, Routine  6. Routine cervical smear Routine pap smear done with normal pelvic exam - IGP, Aptima HPV  7. Encounter for screening mammogram for malignant  neoplasm of breast Routine mammogram ordered, patient has implants  - MM 3D SCREEN BREAST W/IMPLANT BILATERAL; Future      General Counseling: Alexis Hardin verbalizes understanding of the findings of todays visit and agrees with plan of treatment. I have discussed any further diagnostic evaluation that may be needed or ordered today. We also reviewed her medications today. she has been encouraged to call the office with any questions or concerns that should arise related to todays visit.    Orders Placed This Encounter  Procedures   DG Lumbar Spine Complete   MM 3D SCREEN BREAST W/IMPLANT BILATERAL   UA/M w/rflx Culture, Routine   Ambulatory referral to Orthopedic Surgery    No orders of the defined types were placed in this encounter.   Return in about 1 year (around 10/06/2023) for CPE, Alexis Hardin PCP.   Total time spent:30 Minutes Time spent includes review of chart, medications, test results, and follow up plan with the patient.   [place] Controlled Substance Database was reviewed by me.  This patient was seen by Sallyanne Kuster, FNP-C in collaboration with Dr. Beverely Risen as a part of collaborative care agreement.  Arkie Tagliaferro R. Tedd Sias, MSN, FNP-C Internal medicine

## 2022-10-06 NOTE — Telephone Encounter (Signed)
Awaiting 10/06/22 office notes for Orthopedic referral-[facility]

## 2022-10-07 LAB — UA/M W/RFLX CULTURE, ROUTINE

## 2022-10-08 ENCOUNTER — Telehealth: Payer: Self-pay | Admitting: Nurse Practitioner

## 2022-10-08 NOTE — Telephone Encounter (Signed)
Orthopedic referral sent via Prof[facility] to [facility] 

## 2022-10-10 ENCOUNTER — Ambulatory Visit (INDEPENDENT_AMBULATORY_CARE_PROVIDER_SITE_OTHER): Payer: BC Managed Care – PPO | Admitting: Nurse Practitioner

## 2022-10-10 ENCOUNTER — Encounter: Payer: Self-pay | Admitting: Nurse Practitioner

## 2022-10-10 ENCOUNTER — Ambulatory Visit: Payer: BC Managed Care – PPO

## 2022-10-10 ENCOUNTER — Telehealth: Payer: Self-pay

## 2022-10-10 ENCOUNTER — Telehealth: Payer: Self-pay | Admitting: Nurse Practitioner

## 2022-10-10 VITALS — BP 124/86 | HR 83 | Temp 98.5°F | Resp 16 | Ht 60.0 in | Wt 124.6 lb

## 2022-10-10 DIAGNOSIS — L989 Disorder of the skin and subcutaneous tissue, unspecified: Secondary | ICD-10-CM | POA: Diagnosis not present

## 2022-10-10 LAB — IGP, APTIMA HPV: HPV Aptima: NEGATIVE

## 2022-10-10 NOTE — Telephone Encounter (Signed)
Patient was on office today so told her about her pap results.

## 2022-10-10 NOTE — Progress Notes (Signed)
[facility] [address] [place], [place] [address]  Internal MEDICINE  Office Visit Note  Patient Name: Alexis Hardin  [id number]  [id number]  Date of Service: 10/10/2022  Chief Complaint  Patient presents with   Follow-up    Dermatology referral.     HPI Alexis Hardin presents for a follow-up visit for requesting dermatology referral.  Lesion on lower right anterior leg. Per patient, was kicked by a horse 1 year ago and the area has never fully healed. It is a circular raised brown area with a diameter of approx 0.75 in. There is a small scab on the right upper border and slight scaling in the center. She is requesting to have dermatology take a look at it.     Current Medication: Outpatient Encounter Medications as of 10/10/2022  Medication Sig   traMADol (ULTRAM) 50 MG tablet Take 1 tablet (50 mg total) by mouth every 6 (six) hours as needed for up to 5 days for moderate pain or severe pain.   No facility-administered encounter medications on file as of 10/10/2022.    Surgical History: Past Surgical History:  Procedure Laterality Date   COSMETIC SURGERY     REFRACTIVE SURGERY      Medical History: History reviewed. No pertinent past medical history.  Family History: History reviewed. No pertinent family history.  Social History   Socioeconomic History   Marital status: Married    Spouse name: Not on file   Number of children: Not on file   Years of education: Not on file   Highest education level: Not on file  Occupational History   Not on file  Tobacco Use   Smoking status: Never   Smokeless tobacco: Never  Vaping Use   Vaping Use: Never used  Substance and Sexual Activity   Alcohol use: Never   Drug use: Never   Sexual activity: Not Currently  Other Topics Concern   Not on file  Social History Narrative   Not on file   Social Determinants of Health   Financial Resource Strain: Not on file  Food Insecurity: Not on file   Transportation Needs: Not on file  Physical Activity: Not on file  Stress: Not on file  Social Connections: Not on file  Intimate Partner Violence: Not on file      Review of Systems  Constitutional:  Negative for chills, fatigue and unexpected weight change.  HENT:  Negative for congestion, postnasal drip, rhinorrhea, sneezing and sore throat.   Respiratory: Negative.  Negative for cough, chest tightness and shortness of breath.   Cardiovascular: Negative.  Negative for chest pain and palpitations.  Gastrointestinal:  Negative for abdominal pain, constipation, diarrhea, nausea and vomiting.  Musculoskeletal:  Negative for arthralgias, back pain, joint swelling and neck pain.  Skin:  Negative for rash.       Spot on lower anterior right leg where a horse kicked her 1 year ago, the spot has never fully healed. She is worried about it and wants to have dermatology check it out.   Psychiatric/Behavioral:  Negative for behavioral problems (Depression), sleep disturbance and suicidal ideas. The patient is not nervous/anxious.     Vital Signs: BP 124/86   Pulse 83   Temp 98.5 F (36.9 C)   Resp 16   Ht 5' (1.524 m)   Wt 124 lb 9.6 oz (56.5 kg)   SpO2 97%   BMI 24.33 kg/m    Physical Exam Vitals reviewed.  Constitutional:  General: She is not in acute distress.    Appearance: Normal appearance. She is normal weight. She is not ill-appearing.  HENT:     Head: Normocephalic and atraumatic.  Eyes:     Pupils: Pupils are equal, round, and reactive to light.  Cardiovascular:     Rate and Rhythm: Normal rate and regular rhythm.  Pulmonary:     Effort: Pulmonary effort is normal. No respiratory distress.  Skin:    General: Skin is warm.     Capillary Refill: Capillary refill takes less than 2 seconds.     Findings: Lesion and rash present. Rash is scaling.     Comments: Raised circular lesion on anterior lower right leg, approx 0.75 inches in diameter. It is a light brown  color like a mole with a small scab and light scaling in the center and upper right edge. It does not itch and it is not painful. This lesion appeared after she was kicked by a horse about 1 year ago.   Neurological:     Mental Status: She is alert and oriented to person, place, and time.  Psychiatric:        Mood and Affect: Mood normal.        Behavior: Behavior normal.        Assessment/Plan: 1. Skin lesion of right lower extremity Referred to dermatology  - Ambulatory referral to Dermatology   General Counseling: Alexis Hardin verbalizes understanding of the findings of todays visit and agrees with plan of treatment. I have discussed any further diagnostic evaluation that may be needed or ordered today. We also reviewed her medications today. she has been encouraged to call the office with any questions or concerns that should arise related to todays visit.    Orders Placed This Encounter  Procedures   Ambulatory referral to Dermatology    No orders of the defined types were placed in this encounter.   Return if symptoms worsen or fail to improve, for annual physical and otherwise as needed. .   Total time spent:20 Minutes Time spent includes review of chart, medications, test results, and follow up plan with the patient.   [place] Controlled Substance Database was reviewed by me.  This patient was seen by Sallyanne Kuster, FNP-C in collaboration with Dr. Beverely Risen as a part of collaborative care agreement.   Bradely Rudin R. Tedd Sias, MSN, FNP-C Internal medicine

## 2022-10-10 NOTE — Telephone Encounter (Signed)
Dermatology referral sent via [facility] to [facility] 

## 2022-10-10 NOTE — Telephone Encounter (Signed)
Orthopedic appointment 10/10/2022 @ [facility]

## 2022-10-10 NOTE — Progress Notes (Signed)
Pap smear was normal and negative for HPV. Repeat pap smear in 3-5 years per patient preference.

## 2022-10-10 NOTE — Telephone Encounter (Signed)
-----   Message from Sallyanne Kuster, NP sent at 10/10/2022  7:22 AM EDT ----- Pap smear was normal and negative for HPV. Repeat pap smear in 3-5 years per patient preference.

## 2022-10-16 ENCOUNTER — Telehealth: Payer: Self-pay | Admitting: Nurse Practitioner

## 2022-10-16 NOTE — Telephone Encounter (Signed)
Per patient's request, dermatology referral redirected to [facility]; [phone]

## 2022-10-31 ENCOUNTER — Ambulatory Visit
Admission: RE | Admit: 2022-10-31 | Discharge: 2022-10-31 | Disposition: A | Discharge status: Home / Self Care | Payer: BC Managed Care – PPO | Source: Ambulatory Visit | Attending: Nurse Practitioner | Admitting: Nurse Practitioner

## 2022-10-31 DIAGNOSIS — Z1231 Encounter for screening mammogram for malignant neoplasm of breast: Secondary | ICD-10-CM | POA: Diagnosis present

## 2022-12-02 ENCOUNTER — Ambulatory Visit (LOCAL_COMMUNITY_HEALTH_CENTER): Payer: BC Managed Care – PPO

## 2022-12-02 DIAGNOSIS — Z719 Counseling, unspecified: Secondary | ICD-10-CM

## 2022-12-02 DIAGNOSIS — Z23 Encounter for immunization: Secondary | ICD-10-CM | POA: Diagnosis not present

## 2022-12-02 NOTE — Progress Notes (Signed)
Client seen in nurse clinic for vaccine as needed for immigration.  Client reports plan for international travel in the next 6 months.  Vaccine offered per ACIP recommendations.  VIS provided.   Vaccines administered and tolerated well.  After vaccine care reviewed.  Client with no questions.  Client observed for 15 minutes post COVID-19 with no issues Karli Wickizer Sherrilyn Rist, RN

## 2023-04-22 DIAGNOSIS — R001 Bradycardia, unspecified: Secondary | ICD-10-CM

## 2023-04-22 HISTORY — DX: Bradycardia, unspecified: R00.1

## 2023-04-29 ENCOUNTER — Other Ambulatory Visit: Payer: Self-pay

## 2023-04-29 ENCOUNTER — Emergency Department
Admission: EM | Admit: 2023-04-29 | Discharge: 2023-04-29 | Disposition: A | Discharge status: Home / Self Care | Payer: 59 | Attending: Emergency Medicine | Admitting: Emergency Medicine

## 2023-04-29 ENCOUNTER — Ambulatory Visit: Payer: Self-pay

## 2023-04-29 ENCOUNTER — Telehealth: Payer: Self-pay | Admitting: Nurse Practitioner

## 2023-04-29 DIAGNOSIS — R1084 Generalized abdominal pain: Secondary | ICD-10-CM | POA: Diagnosis not present

## 2023-04-29 DIAGNOSIS — R300 Dysuria: Secondary | ICD-10-CM | POA: Insufficient documentation

## 2023-04-29 DIAGNOSIS — R109 Unspecified abdominal pain: Secondary | ICD-10-CM | POA: Diagnosis present

## 2023-04-29 LAB — URINALYSIS, ROUTINE W REFLEX MICROSCOPIC
Bilirubin Urine: NEGATIVE
Glucose, UA: NEGATIVE mg/dL
Hgb urine dipstick: NEGATIVE
Ketones, ur: NEGATIVE mg/dL
Leukocytes,Ua: NEGATIVE
Nitrite: NEGATIVE
Protein, ur: NEGATIVE mg/dL
Specific Gravity, Urine: 1.031 — ABNORMAL HIGH (ref 1.005–1.030)
pH: 5 (ref 5.0–8.0)

## 2023-04-29 LAB — CBC
HCT: 41.2 % (ref 36.0–46.0)
Hemoglobin: 13.3 g/dL (ref 12.0–15.0)
MCH: 26.8 pg (ref 26.0–34.0)
MCHC: 32.3 g/dL (ref 30.0–36.0)
MCV: 83.1 fL (ref 80.0–100.0)
Platelets: 276 10*3/uL (ref 150–400)
RBC: 4.96 MIL/uL (ref 3.87–5.11)
RDW: 14.1 % (ref 11.5–15.5)
WBC: 8.7 10*3/uL (ref 4.0–10.5)
nRBC: 0 % (ref 0.0–0.2)

## 2023-04-29 LAB — COMPREHENSIVE METABOLIC PANEL
ALT: 17 U/L (ref 0–44)
AST: 22 U/L (ref 15–41)
Albumin: 4.2 g/dL (ref 3.5–5.0)
Alkaline Phosphatase: 55 U/L (ref 38–126)
Anion gap: 11 (ref 5–15)
BUN: 24 mg/dL — ABNORMAL HIGH (ref 6–20)
CO2: 23 mmol/L (ref 22–32)
Calcium: 9.2 mg/dL (ref 8.9–10.3)
Chloride: 105 mmol/L (ref 98–111)
Creatinine, Ser: 0.59 mg/dL (ref 0.44–1.00)
GFR, Estimated: >60 mL/min (ref >60)
Glucose, Bld: 91 mg/dL (ref 70–99)
Potassium: 3.8 mmol/L (ref 3.5–5.1)
Sodium: 139 mmol/L (ref 135–145)
Total Bilirubin: 0.4 mg/dL (ref 0.0–1.2)
Total Protein: 7.9 g/dL (ref 6.5–8.1)

## 2023-04-29 LAB — WET PREP, GENITAL
Clue Cells Wet Prep HPF POC: NONE SEEN
Sperm: NONE SEEN
Trich, Wet Prep: NONE SEEN
WBC, Wet Prep HPF POC: <10 (ref <10)
Yeast Wet Prep HPF POC: NONE SEEN

## 2023-04-29 LAB — LIPASE, BLOOD: Lipase: 33 U/L (ref 11–51)

## 2023-04-29 LAB — POC URINE PREG, ED: Preg Test, Ur: NEGATIVE

## 2023-04-29 MED ORDER — KETOROLAC TROMETHAMINE 30 MG/ML IJ SOLN
30.0000 mg | Freq: Once | INTRAMUSCULAR | Status: AC
  Administered 2023-04-29: 30 mg via INTRAMUSCULAR
  Filled 2023-04-29: qty 1

## 2023-04-29 MED ORDER — DICYCLOMINE HCL 10 MG PO CAPS: 10.0000 mg | ORAL_CAPSULE | Freq: Three times a day (TID) | ORAL | 0 refills | Status: DC

## 2023-04-29 NOTE — ED Notes (Addendum)
 Pt more comfortable in bedside chair than on bed. Vitals obtained. Pt A&O x4. NAD noted. Pt scoring an 8/10 pain on pain scale to lower right back. Provider made aware.

## 2023-04-29 NOTE — ED Provider Notes (Signed)
 [place] EMERGENCY DEPARTMENT AT [facility] Provider Note   CSN: [id number] Arrival date & time: 04/29/23  1612     History  Chief Complaint  Patient presents with   Back Pain    Alexis Hardin is a 48 y.o. female.  Presents to the emergency department for evaluation of lower back pain, abdominal pain, dysuria and also some mild vaginal discharge.  Symptoms have been chronic but recently increased.  She denies any vaginal bleeding.  No trauma or injury.  No fevers chills or bodyaches.  No nausea or vomiting.  She has a history of IBS and states she feels if her IBS has been flared up.  She has not been following gastroenterology.  She is tolerating p.o. well.  Pain is mild.  Pain has been improving she describes cramping abdominal pain with bloating.  She has had some periods of constipation with diarrhea  DM       Home Medications Prior to Admission medications   Medication Sig Start Date End Date Taking? Authorizing Provider  dicyclomine  (BENTYL ) 10 MG capsule Take 1 capsule (10 mg total) by mouth 4 (four) times daily -  before meals and at bedtime. 04/29/23  Yes Charlene Debby BROCKS, PA-C      Allergies    Patient has no known allergies.    Review of Systems   Review of Systems  Physical Exam Updated Vital Signs BP 107/65 (BP Location: Left Arm)   Pulse 76   Temp 97.8 F (36.6 C) (Oral)   Resp 17   Ht 5' (1.524 m)   Wt 63.5 kg   LMP 04/18/2023 (Exact Date)   SpO2 100%   BMI 27.34 kg/m  Physical Exam Constitutional:      Appearance: She is well-developed.  HENT:     Head: Normocephalic and atraumatic.  Eyes:     Conjunctiva/sclera: Conjunctivae normal.  Cardiovascular:     Rate and Rhythm: Normal rate.     Pulses: Normal pulses.     Heart sounds: Normal heart sounds.  Pulmonary:     Effort: Pulmonary effort is normal. No respiratory distress.  Abdominal:     General: There is no distension.     Tenderness: There is no abdominal  tenderness. There is no right CVA tenderness, left CVA tenderness or guarding.  Musculoskeletal:        General: Normal range of motion.     Cervical back: Normal range of motion.  Skin:    General: Skin is warm.     Findings: No rash.  Neurological:     General: No focal deficit present.     Mental Status: She is alert and oriented to person, place, and time.  Psychiatric:        Behavior: Behavior normal.        Thought Content: Thought content normal.     ED Results / Procedures / Treatments   Labs (all labs ordered are listed, but only abnormal results are displayed) Labs Reviewed  COMPREHENSIVE METABOLIC PANEL - Abnormal; Notable for the following components:      Result Value   BUN 24 (*)    All other components within normal limits  URINALYSIS, ROUTINE W REFLEX MICROSCOPIC - Abnormal; Notable for the following components:   Color, Urine YELLOW (*)    APPearance HAZY (*)    Specific Gravity, Urine 1.031 (*)    All other components within normal limits  WET PREP, GENITAL  URINE CULTURE  LIPASE, BLOOD  CBC  POC URINE PREG, ED    EKG None  Radiology No results found.  Procedures Procedures    Medications Ordered in ED Medications  ketorolac  (TORADOL ) 30 MG/ML injection 30 mg (30 mg Intramuscular Given 04/29/23 2020)    ED Course/ Medical Decision Making/ A&P                                 Medical Decision Making Amount and/or Complexity of Data Reviewed Labs: ordered.   48 year old female with abdominal discomfort, cramping, periods of diarrhea and constipation.  She is afebrile nontachycardic and vital signs are stable.  Her blood work all remains normal with no elevated white count.  Her urine is clean with no signs of infectious process and wet prep shows no signs of BV, Candida.  Pregnancy test is negative.  Her abdomen is soft nontender nondistended.  No signs of acute abdomen on exam.  Patient is given a prescription for Bentyl  to help with  abdominal cramping and discomfort.  She will also follow-up with GI as this has been a chronic condition for her.  She understands signs symptoms return to the ED for. Final Clinical Impression(s) / ED Diagnoses Final diagnoses:  Generalized abdominal pain  Dysuria    Rx / DC Orders ED Discharge Orders          Ordered    dicyclomine  (BENTYL ) 10 MG capsule  3 times daily before meals & bedtime        04/29/23 2110              Charlene Debby JAYSON DEVONNA 04/29/23 2112    Dorothyann Drivers, MD 05/04/23 2258

## 2023-04-29 NOTE — Telephone Encounter (Signed)
 Patient's boyfriend called to make a sick visit appointment for patient. I explained we do not have any openings until next week. He will have her go to urgent care or [facility]

## 2023-04-29 NOTE — ED Provider Triage Note (Signed)
 Emergency Medicine Provider Triage Evaluation Note  Alexis Hardin , a 48 y.o. female  was evaluated in triage.  Pt complains of back pain that radiates to the right leg, patient has history of IBS.  Denies urinary symptoms  Review of Systems  Positive:  Negative:   Physical Exam  BP (!) 99/51 (BP Location: Left Arm)   Pulse 86   Temp 97.9 F (36.6 C) (Oral)   Resp 18   Ht 5' (1.524 m)   Wt 63.5 kg   LMP 04/18/2023 (Exact Date)   SpO2 100%   BMI 27.34 kg/m  Gen:   Awake, no distress   Resp:  Normal effort  MSK:   Moves extremities without difficulty CVA tenderness negative Other:    Medical Decision Making  Medically screening exam initiated at 4:56 PM.  Appropriate orders placed.  Alexis Hardin was informed that the remainder of the evaluation will be completed by another provider, this initial triage assessment does not replace that evaluation, and the importance of remaining in the ED until their evaluation is complete.    Janit Kast, PA-C 04/29/23 1656

## 2023-04-29 NOTE — ED Triage Notes (Signed)
 Pt sts that she has been having lower back pain. Pt sts that she has IBS as well burning with urination.

## 2023-05-01 LAB — URINE CULTURE: Culture: 80000 — AB

## 2023-06-05 ENCOUNTER — Other Ambulatory Visit: Payer: Self-pay

## 2023-06-05 VITALS — BP 98/61 | HR 53 | Temp 98.3°F | Resp 16 | Ht 60.0 in | Wt 126.3 lb

## 2023-06-05 DIAGNOSIS — Z006 Encounter for examination for normal comparison and control in clinical research program: Secondary | ICD-10-CM

## 2023-06-05 DIAGNOSIS — K589 Irritable bowel syndrome without diarrhea: Secondary | ICD-10-CM

## 2023-06-05 DIAGNOSIS — R001 Bradycardia, unspecified: Secondary | ICD-10-CM

## 2023-06-05 DIAGNOSIS — D649 Anemia, unspecified: Secondary | ICD-10-CM

## 2023-06-05 LAB — POCT URINE PREGNANCY
Preg Test, Ur: NEGATIVE
Preg Test, Ur: NEGATIVE

## 2023-06-05 NOTE — Progress Notes (Addendum)
 EXTEND [id number] Site: [id number] Screening Visit Subj ID: [id number] Date: 14/Feb/2025      [x]  []  Physical Exam Vitals reviewed.  Constitutional:      General: She is not in acute distress.    Appearance: Normal appearance. She is normal weight. She is not ill-appearing, toxic-appearing or diaphoretic.  HENT:     Head: Normocephalic and atraumatic.     Right Ear: Tympanic membrane normal.     Left Ear: Tympanic membrane normal.     Nose: Nose normal.     Mouth/Throat:     Mouth: Mucous membranes are moist.     Pharynx: Oropharynx is clear. No oropharyngeal exudate or posterior oropharyngeal erythema.  Eyes:     Extraocular Movements: Extraocular movements intact.     Conjunctiva/sclera: Conjunctivae normal.     Pupils: Pupils are equal, round, and reactive to light.  Cardiovascular:     Rate and Rhythm: Normal rate and regular rhythm.     Pulses: Normal pulses.     Heart sounds: Normal heart sounds. No murmur heard.    No friction rub. No gallop.  Pulmonary:     Effort: Pulmonary effort is normal. No respiratory distress.     Breath sounds: Normal breath sounds. No wheezing, rhonchi or rales.  Chest:     Chest wall: No tenderness.  Abdominal:     General: Abdomen is flat. Bowel sounds are normal. There is no distension.     Palpations: Abdomen is soft. There is no mass.     Tenderness: There is no abdominal tenderness. There is no right CVA tenderness, left CVA tenderness, guarding or rebound.     Hernia: No hernia is present.  Musculoskeletal:        General: No swelling or tenderness. Normal range of motion.     Cervical back: Normal range of motion and neck supple. No rigidity or tenderness.     Right lower leg: No edema.     Left lower leg: No edema.  Lymphadenopathy:     Cervical: No cervical adenopathy.  Skin:    General: Skin is warm and dry.     Capillary Refill: Capillary refill takes less than 2 seconds.     Coloration: Skin is not jaundiced.     Findings: No  bruising, erythema, lesion or rash.  Neurological:     General: No focal deficit present.     Mental Status: She is alert and oriented to person, place, and time. Mental status is at baseline.     Cranial Nerves: No cranial nerve deficit.     Motor: No weakness.     Coordination: Coordination normal.     Gait: Gait normal.     Deep Tendon Reflexes: Reflexes normal.  Psychiatric:        Mood and Affect: Mood normal.        Behavior: Behavior normal.        Thought Content: Thought content normal.        Judgment: Judgment normal.            INFECTIOUS DISEASE ATTENDING ADDENDUM:   Date: 06/18/2023  Patient name: Alexis Hardin  Medical record number: [id number]  Date of birth: [birth year]   This patient has been  discussed with the Ralene Muskrat. Please see her note I concur with their findings.  Acey Lav 06/18/2023, 6:24 PM

## 2023-06-05 NOTE — Research (Cosign Needed Addendum)
 EXTEND [id number] Site: [id number] Screening Visit Subj ID: [id number] Date: 14/Feb/2025   Yes No Completed: Consent   [x]  []  Participant Identification Verified, Copy Driver's License or ID  [x]  []  Informed Consent (main study):   [x] Explained/Questions answered  [x] Other options reviewed  [x] Participant given adequate time to consider options [x] Comprehension assessed [x] Assess participant's ability to read  [x] Obtained in a language the subject understood        Note interpreter's name, if applicable  [x] Study Medication risks list reviewed with subject [x] Responsibilities of subject reviewed [x] Correct version & version date (ENGLISH)  [] Correct version & version date [facility])  [x] Signed & witnessed prior to screening procedures: Date: 14Feb2025    Time: 0943 [x] Copy to participant: Date: 14Feb2025     [x] Verify version & version date: Version: 19Nov2024    Date: 19Nov2024  Consent for Further Research RELATED TO THIS STUDY:   [x] Yes     [] No  Consent for Further Research NOT RELATED TO THIS STUDY:   [x] Yes     [] No  Consent completed by: Ralene Muskrat, RN     Yes No Completed:  [x]  []  Verify participant identity before doing any study procedures     [x]  []  eCSSRs: Any positive (abnormal) response indicating SIB or any unusual changes in behavior, confirmed by the investigator will result in their discontinuation of study and /or study intervention, the PI/SI will arrange for urgent specialist psychiatric evaluation and management  [x]  []  Register Screening Visit in [facility]  [x]  []  Eligibility Criteria Review (Inclusion/Exclusion Criteria)    [x]  []  RAPID HIV TESTING  [] REACTIVE or [x] NON REACTIVE  If REACTIVE, Participant referred for further testing and clinical management as per local standard of care   [x]  []  Review Acute HIV symptoms: Participants with signs and symptoms suggestive of acute HIV infection in the last 14 days prior to Screening will be excluded.   [x]  None Reported   [x]  []  Demographic Data Obtained:  DOB: 01-Oct-1975  Sex at birth: female Gender identity: female  Patient Race:Other or two or more races [6] Hispanic or Latino    [x]  []  Past Medical History:   Diagnosis Start Date End Date   Bradycardia- Asymptomatic 2025    Irritable bowel syndrome (IBS) 2023    Intermittent Low Back Pain 05Aug2022    Conjunctivitis of left eye 08Jul2023 17Jul2023   Urinary Tract Infection 12Jan2024 17Jan2024   Past Surgical History:   Procedure Start Date End Date   CESAREAN SECTION 2011 2011   CHOLECYSTECTOMY 2011 2011   COSMETIC SURGERY 2020 2020   Breast Augmentation, Gastric Sleeve    REFRACTIVE SURGERY (Lasik) 2006 2006        [x]  []    Patient Active Problem List   Diagnosis Start Date End Date   Bradycardia- Asymptomatic 2025    Irritable bowel syndrome (IBS) 2023    Anemia 17Jun2024    Intermittent Low Back Pain 05Aug2022      [x]  []  Premature cardiovascular disease: female participant <65 years or female participant <55 years in first degree relatives only. [x] No   [] Yes  [x]  []  Substance Usage Review Social History   Tobacco Use   Smoking status: Never    Passive exposure: Never   Smokeless tobacco: Never  Substance Use Topics   Alcohol use: Yes    Alcohol/week: 1.0 standard drink of alcohol    Types: 1 Glasses of wine per week   Drug Use Never   Sexual Activity Yes   Partners: Male   Birth control/protection:  Condom   Comment: 1 partner, husband-vesectomy-2011   Caffeine Use: 3 caffeine containing beverages/day   [x]  []  Concomitant Medication Review and Vaccination history   Outpatient Encounter Medications as of 06/05/2023  Medication Sig   Magnesium Glycinate 100 MG CAPS Take 50 mg by mouth daily.   Nicotinamide Riboside Chloride POWD 1 capsule by Does not apply route daily.   RESVERATROL PO Take 1 capsule by mouth daily.   [DISCONTINUED] Resveratrol-Quercetin (RESVERATROL PLUS PO) Take 1 tablet by mouth daily.    [DISCONTINUED] dicyclomine (BENTYL) 10 MG capsule Take 1 capsule (10 mg total) by mouth 4 (four) times daily -  before meals and at bedtime. (Patient not taking: Reported on 06/05/2023)   No facility-administered encounter medications on file as of 06/05/2023.      [x]  []  Contraception Review: Report in eCRF as applicable Female of childbearing potential: Must agree to use a highly effective method of contraception consistently and correctly, must also continue to use adequate contraception methods for at least 52 weeks after the last injection. Female condoms must be used in addition to hormonal contraception Sexual Abstinence: considered a highly effective method only if defined as refraining from heterosexual intercourse during the entire period of risk associated with the study intervention.  The reliability of sexual abstinence needs to be evaluated in relation to the duration of the study and the preferred and usual lifestyle of the participant. Partner had vasectomy in 2011 + condom use   [x]  []  Triplicate 12-lead ECG: (perform prior to blood draw and injections) Perform in a semi-supine position after 5 minutes of rest.   3 individual ECG tracings should be obtained as closely as possible in succession, but no more than 2 minutes apart within 1 hour prior to first dose, The full set of triplicates should be completed in less than 6 minutes. If ECG abnormal clinically significant  then report as an AE/SAE.    Rest Start Time: 1049 Start Time: ECG #1  1054  ECG #2: 1055    ECG #3: 1056  [x]  []  Vitals:   06/05/23 1102  BP: 98/61  Pulse: (!) 53  Resp: 16  Temp: 98.3 F (36.8 C)  TempSrc: Oral  Weight: 126 lb 5.2 oz (57.3 kg)  Height: 5' (1.524 m)    Body mass index is 24.67 kg/m.   Rest Start Time: 1056  [x]  []  Physical Exam: Completed by Arvilla Meres, PA Vitals reviewed.  Constitutional:      General: She is not in acute distress.    Appearance: Normal appearance. She is  normal weight. She is not ill-appearing, toxic-appearing or diaphoretic.  HENT:     Head: Normocephalic and atraumatic.     Right Ear: Tympanic membrane normal.     Left Ear: Tympanic membrane normal.     Nose: Nose normal.     Mouth/Throat:     Mouth: Mucous membranes are moist.     Pharynx: Oropharynx is clear. No oropharyngeal exudate or posterior oropharyngeal erythema.  Eyes:     Extraocular Movements: Extraocular movements intact.     Conjunctiva/sclera: Conjunctivae normal.     Pupils: Pupils are equal, round, and reactive to light.  Cardiovascular:     Rate and Rhythm: Normal rate and regular rhythm.     Pulses: Normal pulses.     Heart sounds: Normal heart sounds. No murmur heard.    No friction rub. No gallop.  Pulmonary:     Effort: Pulmonary effort is normal. No respiratory distress.  Breath sounds: Normal breath sounds. No wheezing, rhonchi or rales.  Chest:     Chest wall: No tenderness.  Abdominal:     General: Abdomen is flat. Bowel sounds are normal. There is no distension.     Palpations: Abdomen is soft. There is no mass.     Tenderness: There is no abdominal tenderness. There is no right CVA tenderness, left CVA tenderness, guarding or rebound.     Hernia: No hernia is present.  Musculoskeletal:        General: No swelling or tenderness. Normal range of motion.     Cervical back: Normal range of motion and neck supple. No rigidity or tenderness.     Right lower leg: No edema.     Left lower leg: No edema.  Lymphadenopathy:     Cervical: No cervical adenopathy.  Skin:    General: Skin is warm and dry.     Capillary Refill: Capillary refill takes less than 2 seconds.     Coloration: Skin is not jaundiced.     Findings: No bruising, erythema, lesion or rash.  Neurological:     General: No focal deficit present.     Mental Status: She is alert and oriented to person, place, and time. Mental status is at baseline.     Cranial Nerves: No cranial nerve  deficit.     Motor: No weakness.     Coordination: Coordination normal.     Gait: Gait normal.     Deep Tendon Reflexes: Reflexes normal.  Psychiatric:        Mood and Affect: Mood normal.        Behavior: Behavior normal.        Thought Content: Thought content normal.        Judgment: Judgment normal.       Yes No Labs Collection Information: Sent to [facility]  [x]  []  Verified previous Hgb to be >= 10 (if value <10, adjust volume per unit SOP) [x] NA, healthy volunteer or previous Hgb results are unavailable  [x]  []  Participant reports non-study blood collection in the previous 2 onths:      [] Yes, estimated vol. _________mL     [x] No  [x]  []  Is the Participant Fasting:  Not required at Screening [] Yes, Date/Time of last intake        [x] No/Not required     [x]  []  Blood Drawn.    Challenges:  [x] No   [] Yes, describe:    Time 1111 Total Vol 47 mL  [x]  []  Chemistry  [x]  []  Hematology  [x]  []  Non rapid HIV immunoassay  [x]  []  HIV-1 RNA   [x]  []  Neisseria gonorrhea (GC), Chlamydia trachomatis (CT), Trichomononas vaginalis (TV), syphilis.  If positive we will refer for treatment as per local guidelines.  Is participant experiencing any symptomatic STIs currently? [] Yes [x] No [x] GC/CT NAAT urine or vaginal swab  Rectal and oropharyngeal swab can be collected based on reported sexual history. [x] GC and CT: Rectal Swab [x] Oropharyngeal Swab [x] TV: vaginal Swab *If female and menstruating may take sample at next visit  [x]  []  Coagulation Tests   [x]  []  Hepatitis B&C   [x]  []  Plasma for Storage  [x]  []  Urine Drug Toxicology  [x]  []  Urine pregnancy Test  (POCBP only)      [] N/A, not a POCBP  [x]  []  Serum Pregnancy Test  (POCBP only)       [] N/A, not a POCBP   Yes No  Completed:  [x]  []  HIV/STI  Counseling/Condom Distribution [x] Accepted  []   Declined  [x]  []  AE/SAE Review  [x]  []  Assess for Protocol Deviations  [x]  []  Reminders:  Participants of childbearing potential agree to use  a highly effective method of contraception consistently and correctly HIV vaccines are not permitted at any time during the study No other experimental agents, antiretroviral drugs, cytotoxic chemotherapy, or radiation therapy may be administered throughout the trial No systemically administered immunomodulators permitted Notify study staff if you experience any signs/symptoms, visit a provider in ER or at a scheduled evaluation, or start any new OTC, supplemental or prescription therapies  [x]  []  Compensation:  1.You will receive a payment of $78.00 at the end of each non dosing visit completed to reimburse you for your time and effort.  2. You will receive $125.00 at the end of each dosing visit completed to reimburse you for your time and effort.   3. If you must return to the clinic for an unscheduled visit, you will receive $78.  Amount:$78.00  [x]  []  Schedule Entry Visit within 30 days. Participant must be fasting for entry.  Date: 28Feb2025    Time: 0830

## 2023-06-10 ENCOUNTER — Other Ambulatory Visit: Payer: Self-pay | Admitting: Physician Assistant

## 2023-06-15 NOTE — Addendum Note (Signed)
 Addended by: Claudie Leach on: 06/15/2023 03:27 PM   Modules accepted: Orders

## 2023-06-18 NOTE — Research (Cosign Needed Addendum)
 ELIGIBILITY CHECKLIST EXTEND [id number] Protocol Amendment 2 [facility] Research Site: [id number]     4 Letter Code: MELO Subject ID #: [id number]   YES NO Inclusion Criteria: Participants are eligible to be included in the study only if all of the following criteria apply  [x]  []  At the time of obtaining informed consent, adolescent and adult participants weighing at least 35 kg.    [x]  []  Participants must have a nonreactive HIV test at Screening (rapid test, nonrapid HIV immunoassay, and HIV RNA) and enrollment (a rapid test, nonrapid HIV immunoassay, and HIV RNA). Note: All HIV test results from the Screening visit must be obtained and must all be negative/non-reactive. In addition, at least one HIV test result using blood drawn at the Enrollment visit must be obtained prior to randomization into the study and must be negative/non-reactive. Individuals who have one or more reactive or positive HIV test result(s) at Screening or at Day 1 before dosing will not be enrolled, even if subsequent confirmatory testing does not confirm HIV infection. Those with any enrollment HIV test result after dosing that is reactive will proceed through the HIV algorithm but will discontinue study product regardless of subsequent test results.  [x]  []  Participants who are at risk of acquiring HIV, defined as having had anal or vaginal sex in the past 6 months.  [x]  []  Participants who are overtly healthy as determined by medical evaluation by a responsible and experienced physician, including medical history, physical examination, laboratory tests and cardiac monitoring at the time of screening.  Note: A participant with a significant clinical abnormality or laboratory parameter(s) which is/are not specifically listed in the inclusion or exclusion criteria, outside the reference range for the population being studied may be included if the investigator determines and documents that the finding is unlikely to introduce additional risk  factors and will not interfere with the study procedures. A single repeat of a procedure or lab parameter is allowed to determine eligibility.  [x]  []  No alcohol or substance use that, in the opinion of the study investigator and medical monitor, would interfere with the conduct of the study (e.g., provided by self-report, or found upon medical history and examination or in available medical records).  [x]  []  Participants who have received oral PrEP (TDF/FTC; TAF/FTC), are eligible, but they must discontinue oral PrEP within 10 days of Day 1 visit.  [x]  []  Female or female at birth (transgender individuals are not excluded). Contraceptive use by men and women should be consistent with local regulations regarding the methods of contraception for those participating in clinical studies. Participants assigned female at birth are eligible to participate if they are not pregnant or breastfeeding, and at least one of the following conditions applies: Is not a person of childbearing potential (POCBP) as defined in Section [phone]: Contraception and Research officer, political party. OR Is a POCBP and using a contraceptive method that is highly effective, with a failure rate of <1%, as described in Section 11.4.2 from the time of screening and inclusive of the entire time while on study. The investigator should evaluate the effectiveness of the contraceptive method in relationship to the first dose of study intervention. A POCBP must have a negative highly sensitive pregnancy test (urine and serum as required) within the 30 days before the dose of study intervention.  Additional requirements for pregnancy testing during and after study intervention are located in Section 8.4.4, Pregnancy Testing. The investigator is responsible for review of medical history, menstrual history, and recent sexual activity to  decrease the risk for inclusion of a woman with an early undetected pregnancy.  [x]  []  Participants must be >110 years of age,  be of legal age to consent to sexual intercourse and capable of giving written informed consent, which includes compliance with the requirements and restrictions listed in the informed consent form (ICF) and in this protocol. For adolescents:  Willing to provide written informed assent/consent for the study and/or able to obtain written parental/guardian informed consent;  If not of legal age or otherwise not able to provide independent informed consent as determined by site SOPs and consistent with site IRB/EC policies and procedures: Parent or legal guardian is willing and able to provide written informed consent for study participation and potential participant is willing and able to provide written assent for study participation.  If of legal age or otherwise able to provide independent informed consent as determined by site SOPs and consistent with site IRB/EC policies and procedures: Willing and able to provide written informed consent for study participation.   YES NO Exclusion Criteria: Participants are excluded from the study if any of the following criteria apply  []  [x]  One or more reactive or positive HIV test results at Screening or Enrollment, even if HIV infection was not confirmed.  *No at screening, will reevaluate at enrollment and notify PI if any changes would affect enrollment.  []  [x]  Participants who are breastfeeding or plan to become pregnant or breastfeed during the study.   []  [x]  Alanine aminotransferase (ALT) >=3 times the upper limit of normal (ULN).   []  [x]  Evidence of active Hepatitis B virus (HBV) infection based on the results of testing at Screening for Hepatitis B surface antigen (hBsAg), Hepatitis B core antibody (anti-HBc), Hepatitis B surface antibody (anti-HBs) and HBV DNA as follows:  Participants positive for HBsAg or HBV DNA are excluded.  Participants negative for anti-HBs but positive for anti-HBc (negative HBsAg status) and positive for HBV DNA are excluded.   Note: Participants positive for anti-HBc (negative HBsAg status) and positive for anti-HBs (past and/or current evidence) are immune to HBV and are not excluded.   []  [x]  Unstable liver disease (as defined by any of the following: presence of ascites, encephalopathy, coagulopathy, hypoalbuminemia, esophageal or gastric varices, or persistent jaundice or cirrhosis), known biliary abnormalities (with the exception of Gilbert's syndrome or asymptomatic gallstones or otherwise stable chronic liver disease per investigator assessment).    []  [x]  History of clinically relevant hepatitis within last 6 months.   []  [x]  Known history of liver cirrhosis with or without viral hepatitis co-infection.   []  [x]  Participants with HCV co-infection will be allowed entry into this study if:   Liver enzymes meet entry criteria.  HCV Disease has undergone appropriate work-up, and is not advanced, and will not require treatment prior to the primary endpoint (e.g., Month 13). Additional information (where available) on participants with HCV co-infection at screening should include results from any liver biopsy, Fibroscan, ultrasound, or other fibrosis evaluation, history of cirrhosis or other decompensated liver disease, prior treatment, and timing/plan for HCV treatment.   In the event that recent biopsy or imaging data is not available or inconclusive, the Fib-4 score will be used to verify eligibility:  i.  Fib-4 score >3.25 is exclusionary.   ii. Fib-4 scores 1.45 - 3.25 requires Medical Monitor consultation.   Fibrosis 4 score Formula: (Age x AST) / (Platelets x (sqr [ ALT])  It is approved by the Medical Monitor.   []  [x]  Creatinine clearance  of <30 mL/min/1.73 m2 via CKD-EPIcr_R (2021) method.   []  [x]  Any acute laboratory abnormality at Screening, which, in the opinion of the investigator, would preclude the participant's participation in the study of an investigational compound.   []  [x]  Any verified Grade 4  laboratory abnormality, with the exception of Grade 4 [triglycerides or lipid abnormalities]. A single repeat test is allowed during the Screening period to verify a result.   []  [x]  Participants determined by the Investigator to have a high risk of seizures, including participants with an unstable or poorly controlled seizure disorder. A participant with a prior history of seizure may be considered for enrolment if the Investigator believes the risk of seizure recurrence is low. All cases of prior seizure history should be discussed with the Medical Monitor prior to enrolment.   []  [x]  Participant is currently participating in or has participated in a study with a compound or device that is not commercially available within 30 days of signing informed consent, unless permission from the Medical Monitor is granted.   []  [x]  Presence of any history of allergy/sensitivity to any of the study drug.    []  [x]  Inflammatory skin conditions that compromise the safety of IM injections, per the discretion of the investigator. Mild skin conditions may not be exclusionary at the discretion of the investigator or designee.    []  [x]  Participant has an implant/enhancement (including fillers) at the area of proposed injection (e.g., gluteus medius); or tattoo or other dermatological condition overlying the area for IM (e.g., gluteus medius) or any other area which may significantly interfere with interpretation of injection site reactions.    []  [x]  Current or anticipated need for chronic anti-coagulants or active coagulopathy (primary or iatrogenic) which would contraindicate IM injection.    []  [x]  Ongoing or clinically relevant pancreatitis.   []  [x]  Clinically significant cardiovascular disease or history of clinically significant cardiovascular disease, as defined by history/evidence of congestive heart failure, symptomatic arrhythmia, myocardial infarction, documented hypertrophic cardiomyopathy, sustained  ventricular tachycardia, angina/ischemia, coronary artery bypass grafting (CABG) surgery or percutaneous transluminal coronary angioplasty (PTCA) or any clinically significant cardiac disease.   []  [x]  All participants will be screened for STIs (e.g., chlamydia, gonorrhea, trichomoniasis, syphilis). Participants with untreated infections are excluded. Participants may be rescreened at least 24 hours after completion of STI treatment.    *Untreated syphilis infection includes positive syphilis testing at Screening without clear documentation of treatment. Participants who are at least 24 hours post completed successful treatment are eligible.   []  [x]  History or presence of sensitivity to any of the study medications or their components or drugs of their class, or a history of drug or other allergy that, in the opinion of the investigator or Medical Monitor, contraindicates their participation. In addition, if heparin is used during PK sampling, subjects with a history of sensitivity to heparin or heparin-induced thrombocytopenia should not be enrolled.   []  [x]  Ongoing uncontrolled malignancy is excluded, whereas participants who have controlled localized malignancies may be included on agreement between the investigator and the Medical Monitor.   []  [x]  Participants who in the investigator's judgment, poses a significant suicidality risk. Participant's history of suicidal behavior and/or suicidal ideation should be considered when evaluating for suicide risk.   []  [x]  Any pre-existing physical or mental condition (including substance abuse disorder) which, in the opinion of the Investigator or the medical monitor, may interfere with the participant's ability to comply with the dosing schedule and/or protocol evaluations or which may compromise  the safety of the participant.   []  [x]  Any condition which, in the opinion of the Investigator or the medical monitor, may interfere with the absorption,  distribution, metabolism or excretion of the study drugs or render the participant unable to receive IM medication.   []  [x]  Co-enrollment in any other interventional research study or other concurrent studies which may have interfered with this study (as provided by self-report or other available documentation). Exceptions for non-interventional studies may be made if appropriate after consultation with the Medical Monitor.   []  [x]  Participants receiving any protocol-prohibited medication and who are unwilling or unable to switch to an alternate medication.    []  [x]  Use of ART for Postexposure Prophylaxis within the 90 days prior to Day 1.   []  [x]  Use of CAB LA for PrEP at any time prior to Screening.   []  [x]  Exposure to an experimental drug or experimental vaccine within either 28 days, 5 half-lives of the test agent, or twice the duration of the biological effect of the test agent, whichever is longer, prior to the first dose of IP.   []  [x]  Anticipated need for HCV therapy with interferon or any drugs that have potential for adverse drug:drug interactions with study treatment throughout the entire study period.    []  [x]  Treatment with an HIV-1 preventive vaccine within 90 days of Screening.   []  [x]  Participant is unlikely to adhere to the study procedures, keep appointments, is planning to relocate during the study, or remain on study through to its conclusion.    []  [x]  Participants who are considered high-risk, meeting at least one of the following criteria: Participant who has exchanged condomless sex for goods or money within the past 12 months prior to Screening. Participant who has used recreational intravenous drugs within the past 12 months prior to Screening. Participant who has participated in [employer]* practice (such as the use of cocaine, crack cocaine, methamphetamine, ketamine, 3,4-methlenedioxy-methamphetamine, GHB, mephedrone or prescription drugs apart from those prescribed by a  licensed provider) within the past 6 months prior to Screening. Participant with any STI in the last 6 months also reporting any partners for condomless sex. Participant who has condomless sex with a serodiscordant partner who has a detectable viral load or is not on ART. Participant with any other behavior assessed by the investigator as high-risk sexual behavior.   *Chemsex is defined as the voluntary intake of psychoactive and non-psychoactive drugs (such as ecstasy, cocaine, crack cocaine, methamphetamine, ketamine, 3,4-methylenedioxy-methamphetamine, GHB, mephedrone or prescription drugs apart from those prescribed by a licensed provider) in the context of recreational settings to facilitate and/or to enhance sexual intercourses.  []  [x]  Participant has in the last 14 days prior to Screening presented with signs and symptoms, which, in the opinion of the investigator, are suggestive of acute HIV infection. Participants may only be enrolled if clinical suspicion of HIV is ruled out with non-reactive results.   []  [x]  Participant becomes a ward of state (e.g., child in care).     By signing this Eligibility Document, I have reviewed the above criteria and find the individual Is: Eligible to participate in EXTEND [id number].  Any clinically significant changes occurring between PI sign off and date of entry have been reviewed with the PI.

## 2023-06-18 NOTE — Research (Cosign Needed Addendum)
       Ketamine and Normetamine labs- out of stability, not required for Day 1 eligibility, will recollect at Day 1.

## 2023-06-19 ENCOUNTER — Other Ambulatory Visit: Payer: Self-pay

## 2023-06-19 VITALS — BP 109/66 | HR 56 | Temp 97.6°F | Resp 16 | Wt 126.8 lb

## 2023-06-19 DIAGNOSIS — Z006 Encounter for examination for normal comparison and control in clinical research program: Secondary | ICD-10-CM

## 2023-06-19 LAB — POCT RAPID HIV
Rapid HIV, POC: NEGATIVE
Rapid HIV, POC: NEGATIVE

## 2023-06-19 LAB — POCT URINE PREGNANCY: Preg Test, Ur: NEGATIVE

## 2023-06-19 MED ORDER — STUDY - EXTEND - CABOTEGRAVIR LA (GSK1265744) 600MG/3ML IM INJECTION (PI-VAN DAM)
600.0000 mg | INJECTION | Freq: Once | INTRAMUSCULAR | Status: AC
Start: 2023-06-19 — End: 2023-06-19
  Administered 2023-06-19: 600 mg via INTRAMUSCULAR
  Filled 2023-06-19: qty 3

## 2023-06-19 NOTE — Research (Addendum)
 Study: [id number] Site: [id number] Subject ID: [id number] Letter Code: melo 19 Jun 2023  Day 1 Assessments should occur in the following order:  eCSSRs  All other questionnaires  Triplicate 12-lead ECG  Vital signs  Blood draws (clinical laboratory tests, pre-dose pharmacokinetics, etc)  IP injection  Post-dose pharmacokinetics (if required)  Yes No Completed:  [x]  []  Verify participant identity before doing any study procedures  [x]  []  Verify Current Consent Version  [x]  []  Confirm Eligibility Verification (Inclusion/Exclusion Criteria) Have you used ART for PEP since screening or in the last 90 days? [] Yes [x] No Confirm all prior HIV results are negative from screening and rapid HIV result from today are negative   [x] Confirmed  [x]  []  Register Day 1 Visit in [facility]   Yes No Patient Reported Outcome Assessment (Questionnaires):  Must be completed before other assessments take place at each designated visit. In the order listed below. Completed electronically.  [x]  []  eCSSRs: Any positive  (abnormal) response indicating SIB or any unusual changes in behavior, confirmed by the investigator will result in their discontinuation of study intervention, the PI/SI will arrange for urgent specialist psychiatric evaluation and management  [x]  []  Social Determinants of Health Questionnaire (SDoH)  [x]  []  HIV-risk and PrEP use Anxiety and Stigma Questionnaire prior to the study drug administration   [x]  []  Sexual Health Practices Questionnaire-administer piror to study drug administration   [x]  []  Anxiety GAD-7 Questionnaire   Yes No Completed:  [x]  []  Has there been any changes to Medical History or have you experienced any new or worsening/improving symptoms since last screening visit? [] Yes [x] No Patient Active Problem List   Diagnosis Date Noted   Bradycardia 2025   Anemia 10/06/2022   Irritable bowel syndrome (IBS) 2023   Intermittent Low back pain 11/23/2020     [x]  []  Review  Concomitant Medications Outpatient Encounter Medications as of 06/19/2023    Medication Sig Start Date End Date   Magnesium Glycinate 100 MG CAPS Take 50 mg by mouth daily. 01Dec2024    Nicotinamide Riboside Chloride POWD 1 capsule by mouth daily 01Dec2024    RESVERATROL PO Take 1 capsule by mouth daily. 01Dec2024    Facility-Administered Encounter Medications as of 06/19/2023  Medication   Study - EXTEND - cabotegravir LA [phone]) 600 mg/3 mL intramuscular injection ([facility])    Current Meds  Medication Sig   Magnesium Glycinate 100 MG CAPS Take 50 mg by mouth daily.   Nicotinamide Riboside Chloride POWD 1 capsule by Does not apply route daily.   RESVERATROL PO Take 1 capsule by mouth daily.       [x]  []  Contraception Review: Report in eCRF Female of childbearing potential: Must agree to use a highly effective method of contraception consistently and correctly, must also continue to use adequate contraception methods for at least 52 weeks after the last injection. Female condoms must be used in addition to hormonal contraception Sexual Abstinence: considered a highly effective method only if defined as refraining from heterosexual intercourse during the entire period of risk associated with the study intervention.  The reliability of sexual abstinence needs to be evaluated in relation to the duration of the study and the preferred and usual lifestyle of the participant.  Partner with vasectomy 2011   Yes No Completed:  [x]  []  Triplicate 12-lead ECG: (perform prior to blood draw and injections) Perform in a semi-supine position after 5 minutes of rest.  3 individual ECG tracing should be obtained as closely as possible in succession, but  no more than 2 minutes apart within 1 hour prior to first dose, The full set of triplicates should be completed in less than 6 minutes. If ECG abnormal clinically significant  then report as an AE/SAE.  Rest Start Time: 0908 Start Time  ECG #1 :  0915     ECG #2: 0916     ECG #3: 0916   [x]  []  Height: (only at screening if adult)  Weight:  Last Weight  Most recent update: 06/19/2023  9:25 AM    Weight  57.5 kg (126 lb 12.2 oz)            Remove footwear and all heavy objects. Note: weight in kilograms to 1 decimal place in the eCRF.  BMI: Body mass index is 24.76 kg/m.  [x]  []  Vital Signs: RR,Temp, BP, HR: measure in semi-supine position after 5 minutes of rest  Rest Start Time: 0917  Vital Sign Start time: 0923 Vitals:   06/19/23 0923  BP: 109/66  Pulse: (!) 56  Resp: 16  Temp: 97.6 F (36.4 C)     Yes No Completed:  [x]  []  Symptom Directed Physical Exam: A brief physcial examination will include, at a minimum, assessments of the skin, lungs, cardiovasculat system, and abdomen (liver and spleen.) Physical Exam Constitutional:      Appearance: Normal appearance.  Cardiovascular:     Rate and Rhythm: Normal rate and regular rhythm.  Pulmonary:     Effort: Pulmonary effort is normal.     Breath sounds: Normal breath sounds.  Abdominal:     General: Bowel sounds are normal.     Palpations: Abdomen is soft.  Skin:    General: Skin is warm and dry.  Neurological:     Mental Status: She is alert.  Psychiatric:        Mood and Affect: Mood normal.        Behavior: Behavior normal.      Yes No Blood Draw Assessment Completed:  [x]  []  Verified previous Hgb to be >= 10 (if value <10, adjust volume per unit SOP)        [] N/A, healthy volunteer or previous Hgb results are unavailable  [x]  []  Participant reports non-study blood collection in the previous 2 months?                                                       []  Yes, estimated vol. mL    [x] No  [x]  []  Is the Participant Fasting: [x] Yes, Date/Time of last intake:  27Feb2025 @ 2100 [] No/Not required    Overnight fast is preferred; however, a minimum of a 6-hour fast is acceptable for participants with afternoon appointments. Encouraged that participants receive a  snack following required study assessments and prior to a scheduled injection Snack provided? [] Yes  [] No    [x] Declined  [x]  []  Blood Drawn.   Problems?   [x]  No   [] Yes, describe  Time: 0933  Total Vol: 38mL   Yes No Lab Collection Completed:  [x]  []  RAPID HIV TESTING [] REACTIVE or [x] NON REACTIVE  If positive/reactive participant must be withdrawn from the study even if subsequent confirmatory testing is negative. If positive, refer for further testing and clinical management as per local standard of care  Is participant experiencing any signs or symptoms consistent with acute HIV infection:   [  x]No [] Yes; If yes, participant will not be enrolled unless acute HIV infection can be ruled out and consultation with Medical Monitor  [x]  []  Non rapid HIV immunoassay  [x]  []  HIV RNA   [x]  []  Neisseria gonorrhea (GC), Chlamydia trachomatis (CT), Trichomononas vaginalis (TV), syphilis.  If positive we will refer for treatment as per local guidelines.  Is participant experiencing any symptomatic STIs currently? [] Yes [x] No  [x] GC/CT NAAT urine/vaginal swab [x] GC and CT: Rectal Swab [x] Oropharyngeal Swab [x] TV: vaginal Swab  *If female and menstruating may take sample at next visit  [x]  []  PK Sampling for CAB on Day 1: Pre dose: </=1 hour before injection  [x]  []  Clinical chemistry (inc lipid panel): Fasting  [x]  []  Hematology  [x]  []  Coagulation testing  [x]  []  Plasma for Storage  [x]  []  Urinalysis (morning specimen preferred)      Time of Collection: 0830   []  N/A, not clinically indicated  [x]  []  Urine pregnancy test for POCBP only      Time: 0830       [] N/A, not a POCBP *If positive perform a serum test to confirm.  Results must be available prior to administering CAB LA injections.    Yes No Study Treatment:     CAB LA [x]                                                [] N/A  [x]  []  Medication Verification: Confirm receipt of CAB LA, confirmed medication label against medication  order, temperature recorded and scanned into [facility].     [x]  []  Administer Cabotegravir 600mg /42ml IM in ventrogluteal muscle: if BMI>/=30 requires longer needle length 2"; gauge can be 21-25 Lot Number: [id number]   Expiration Time: 28Feb2025 1105 Location: Left [x] / Right [] Ventrogluteal Muscle      Needle Length: 1.5in Needle Gauge: 22g BMI: Body mass index is 24.76 kg/m.   Time of administration: 0940   Day 1: Initial injection of CAB LA Month 1: Second injection of CAB LA  Month 3: First injection of CAB ULA  Months 5,9,13,17,21,25, and 29: Q4M maintenance of CAB ULA  [x]   []  ISR Review completed, ISR photography necessary  [] Yes   [x] No *Digital photographs will be documented at all visits on all participants who have an injection site reaction that is a visible, persistent Grade 2 (no improvement/resolution in >10 days), Grade >/=3, or serious.   Yes No Blood Draw Assessment Completed:  [x]  []  PK CAB LA 600 mg  POST DOSE approximately 2 hours after injection (+/-30 minute   collection window)  Dosing Time: 0940  PK Draw Time: 1151    Yes No Completed:  [x]  []  Assess for AEs, SAEs: All AEs (from Day 1 onwards) and SAEs (from Screening onwards) must be recorded in eCRF at all visits and reported within required timelines.  [x]  []  Assess for Protocol Deviations  [x]  []  HIV/STI Counseling/Condom Distribution  [x]  []  Reminders:   1. Participants of childbearing potential agree to use a highly effective method of contraception consistently and correctly 2. HIV vaccines are not permitted at any time during the study 3. No other experimental agents, antiretroviral drugs, cytotoxic chemotherapy, or radiation therapy my not be administered throughout the trial 4. No systemically administered immunomodulator's permitted 5. Notify study staff if you experience any signs/symptoms, visit provider in ER or scheduled evaluation, or start  any new OTC, supplemental or prescription therapies  [x]   []  Compensation:  You will receive a payment of $78.00 at the end of each non dosing visit completed to reimburse you for your time and effort.  You will receive $125.00 at the end of each dosing visit completed to reimburse you for your  time and effort.   If you must return to the clinic for an unscheduled visit, you will receive $78.  Amount: $125.00[x][]  Schedule Next Visit:Visits should be planned using a calendar months and scheduled based on the date of the Day 1 injection.    Date: 07Mar2025    Time: 0830    Version 1.1 24Feb2025 JLS

## 2023-06-26 ENCOUNTER — Encounter

## 2023-06-26 ENCOUNTER — Other Ambulatory Visit: Payer: Self-pay

## 2023-06-26 DIAGNOSIS — Z006 Encounter for examination for normal comparison and control in clinical research program: Secondary | ICD-10-CM

## 2023-06-26 NOTE — Research (Addendum)
 Marthe Patch [id number]                                                  Protocol Amendment 02  [facility] Research Site:  [phone]                                                                  TREATMENT PHASE  Visit: [x]  Day1+1W     []  M1    []  M1+1W    []  M2    []  M3    []  M3+1W    []  M4    [] M5     [] M5+1W   [] M6   [] M6+2W   [] M7   [] M7+2W   [] M8   [] M8+2W   [] M9 [] M9+1W   [] M10   [] M11   [] M12   [] M13   [] M15    [] M17   [] M19   [] M21   [] M23   [] M25   [] M27   [] M29   [] M31   [] M33   [] ED  [] 4 wk followup (if not entering LTFU)   [] LTFU M1   [] LTFU M4   [] LTFU M8   [] LTFU M12     Date: 07Mar2025         4 Letter Code: MELO Subject ID: [id number]  Assessments should occur in the following order:  eCSSRs  All other questionnaires  Triplicate 12-lead ECG  Vital signs  Blood draws (clinical laboratory tests, pre-dose pharmacokinetics, etc)  IP injection  Post-dose pharmacokinetics (if required)  Yes No Completed:  [x]  []  Verify participant identity before doing any study procedures   All Visits  [x]  []  Verify correct version of ICF is signed   All Visits   Yes No Questionnaires Completed: Patient Reported Outcome Assessment must be completed before other assessments take place at each designated visit. Completed electronically by participants.  []  [x]  eCSSRs    Visits: M1, M3, M5, M9, M13, M17, M21, M25, M29, M33, ED Any positive  (abnormal) response indicating SIB or any unusual changes in behavior, confirmed by the investigator will result in their discontinuation of study intervention, the PI/SI will arrange for urgent specialist psychiatric evaluation and management  []  [x]  Social Determinants of Health Questionnaire (SDoH)     Visits: M17, ED  []  [x]  Study Medication Satisfaction Questionnaire (SMSQs)     Visits: M1, M3, M5, M9, M13, M17, M21, M25, M29, M33, ED  []  [x]  PrEP Preference and Rationale Questionnaire (only if prior exposure to oral PrEP)     Visits: M9, M17, M25, M33, ED  []  [x]   Acceptability of Injection Questionnaire     Visits: M1, M3, M5, M9, M13, M17, M21, M25, M29, M33, ED  []  [x]  Generalized Anxiety Disorders (GAD-&) Questionnaire     Visits: M1, M3, M5, M9, M13, M17, M21, M25, M29, M33, ED  []  [x]  HIV-risk and PrEP use Anxiety and Stigma Questionnaire prior to the study drug administration     Visits: M17, M29, M33, ED  []  [x]  Sexual Health Practices Questionnaire-administer prior to study drug administration Visits: M1, M3, M5, M9, M13, M17, M21, M25, M29, M33, ED  Yes No Completed:  [x]  []  Review Changes in Medical History    All Visits  [x]  []  Review Changes in Concomitant Medications    All Visits  [x]  []  Assess for AEs, SAEs: All AEs (from Day 1 onwards) and SAEs (from Screening onwards) must be recorded in eCRF at all visits and reported within required timelines.    All Visits  [x]  []  Pull Review in:  Medication Review Medication Indication Dose/Route/Frequency Start Date End Date  Magnesium Glycinate Prophylaxis 50mg  PO QD 01Dec2024   Nicotinamide Riboside Chloride Powder Prophylaxis 1 Capsule QD 01Dec2024   Resveratrol Prophylaxis 1 Capsule QD 01Dec2024    AE Review Diagnosis/Symptoms Indicate: NSAE SAE AESI Maximum Grade Related/Not Related to study intervention CAB LA or CAB ULA Action/Dose Change Start Date (Time, if available) End Date (Time, if available)  Left Ventrogluteal Injection Site Tenderness NSAE 1 Related to CAB LA No Change, No action taken 28Feb2025 02Mar2025  Urinary Tract Infection NSAE 2 Not Related to study intervention No change, no action taken 04Mar2025                    Medical History Review  Diagnosis Grade Start Date End Date  Bradycardia, asymptomatic 1 Unk 2025   Irritable Bowel Syndrome 2 Unk 2023   Intermittent Low Back Pain 2 05Aug2022   Conjunctivitis of Left Eye 2 08Jul2023 17Jul2023  Urinary Tract Infection 2 12Jan2024 17Jan2024  Anemia 2 17Jun2024      []  [x]  Contraception Review: Report in  EcRF Visits: M1, M2, M3, M4, M5, M6, M7, M8, M9, M11, M13, M17, M21,M25, M29, M31, M33, ED, 4WFU, LTFU Visits Female of childbearing potential: Must agree to use a highly effective method of contraception consistently and correctly, must also continue to use adequate contraception methods for at least 52 weeks after the last injection. Female condoms must be used in addition to hormonal contraception Sexual Abstinence: considered a highly effective method only if defined as refraining from heterosexual intercourse during the entire period of risk associated with the study intervention.  The reliability of sexual abstinence needs to be evaluated in relation to the duration of the study and the preferred and usual lifestyle of the participant.   Yes No/NA Completed:  []  [x]  Premature cardiovascular disease: female participant <65 years or female participant <55 years in first degree relatives only.  Note changes since the start of the study.     Months 5, 13, 21, 29     [] No Changes    []  [x]  Substance Usage: (list name of substance, amount used, start and stop dates)  Months 5, 73, 63, 25 Social History   Substance and Sexual Activity  Alcohol Use Yes   Alcohol/week: 1.0 standard drink of alcohol   Types: 1 Glasses of wine per week    Social History   Substance and Sexual Activity  Drug Use Yes   Types: Marijuana   Comment: + on urine screen    Tobacco Use: Low Risk  (06/19/2023)   Patient History    Smoking Tobacco Use: Never    Smokeless Tobacco Use: Never    Passive Exposure: Never    Caffeine Use: caffeine containing beverages/day  []  [x]  Triplicate 12-lead ECG     Visit: M1, M3, M5, M9, M29 Perform in a semi-supine position after 5 minutes of rest.  3 individual ECG tracing should be obtained as closely as possible in succession, but no more than 2 minutes apart. The full set of triplicates  should be completed in less than 6 minutes. If ECG abnormal clinically significant  then  report as an AE/SAE.  Rest Start Time:  ECG #1 start time:    ECG #2:    ECG #3:    []  [x]  Vital Signs:  [] Weight, BMI (after 5 mins in a semi-supine position)    Only on Visits: M1, M3, M5, M9, M13, M17, M21, M25, M29, M33, ED, 4W FU, LTFU Visits  [] Height    Visit: M9  [] BP and Pulse (measured in a semi supine position after 5 minutes rest)     Visit: M1, M3, M5, M9, M13, M17, M21, M25, M29, M33, ED, 4WFU  [] Temperature and RR:    Visit: M13, ED  Rest Start Time:  Vital Signs Start Time:     There is no height or weight on file to calculate BMI.  There were no vitals filed for this visit.   []  [x]  Brief Symptom Directed Physical Examination: Will include at a minimum, assessments of skin, lungs, CV, abdomen (liver and spleen). Any abnormalities must be noted in the CRF (e.g current medical conditions or AE logs).     Visit: M1, M3, M5, M6, M7, M8, M9, M11, M13, M17, M21, M25, M29, M33, ED, 4W FU, LTFU      Yes No Blood Draw Tracking:  All Visits  [x]  []  Verified previous Hgb to be >= 10 (if value <10, adjust volume per unit SOP)                          [] NA, healthy volunteer, previous Hgb results are unavailable  [x]  []  Does participant report non-study blood collection in the previous 2 months?  [] Yes, estimated vol. mL    [x]  No  []  [x]  Assess Fasting Status.   Visits: M13, M33                                                      Is participant Fasting for at least 6 hours, overnight fast preferred?  []  No   []  Yes, Date/Time of last meal?   [x]  []  Blood Drawn.     Problems?  [x]  No   [] Yes, why?   Time: 0836    Total Vol: 2 mL   Yes No Lab Collection:  []  [x]  RAPID HIV TESTING        Visit: M1, M3, M5, M9, M13, M17, M21, M25, M29, M33, ED, 4WFU  [] REACTIVE or [] NON REACTIVE  If positive/reactive participant must be withdrawn from the study even if subsequent confirmatory testing is negative. If reactive participant referred for further testing and clinical management as  per local standard of care  Is participant experiencing any signs or symptoms consistent with acute HIV infection  [] Yes [] No   []  [x]  Chemistry     Visits: M1, M3, M4, M5, M6, M7,M8, M9, M11, M13, M17, M21, M25, M29, M33, ED, 4WFU, LTFU  []  [x]  Hematology     Visits: M1, M3, M4, M5, M6, M7, M8, M9, M11, M13, M17, M21, M25, M29, M33, ED, 4WFU, LTFU  []  [x]  Non rapid HIV immunoassay    Visits: M1, M3, M5, M9, M13, M17, M21, M25, M29, M33, ED, 4WFU LTFU  []  [x]  HIV-1 RNA     Visits: M1, M3, M5,  M9, M13, M17, M21, M25, M29, M33, ED, 4WFU, LTFU  []  [x]  Neisseria gonorrhea (GC), Chlamydia trachomatis (CT), Trichomononas vaginalis (TV), syphilis.  If positive we will refer for treatment as per local guidelines.   Visit: M1, M3, M5, M9, M13, M17, M21, M25, M29, M33, ED, 4WFU, LTFU  PRN if symptomatic at other visits  Is participant experiencing any symptomatic STIs currently? [] Yes [] No  [] GC/CT NAAT urine/vaginal swab  *Rectal and oropharyngeal swab can be collected based on reported sexual history. [] GC and CT: Rectal Swab [] Oropharyngeal Swab [] TV: vaginal Swab  *If female and menstruating may take sample at next visit  []  [x]  Coagulation Tests     Visit: M13, M33  []  [x]  Hepatitis B&C     Visit: M13, M25, ED  []  [x]  Fasting Labs: Glucose     Visit: M13 and M33  (Insulin, HbA1c only collect if withdrawal visit occurs at M13 or M33)  []  [x]  Plasma for Storage     Visits: M1, M2, M3,M4, M5, M6, M7,M8, M9, M11, M13, M17, M21, M25, M29, ED, LTFU Visits  []  [x]  Predose: PK Sampling  Collect < 1 hr before injection Visits: M1, M3, M5, M9, M13, M17, M21, M25, M29  []  [x]  Illicit Drug Screen     Visits: M5, M13, M21, M29, M33  []  [x]  Urinalysis: Morning specimen is preferred     Visits: M13, M33, ED, 4WFU  Time of Collection:   LMP:  []  N/A   Yes No/NA Lab Collection, Cont.:  []  [x]  Urine Pregnancy Test (POCBP only)  Visits: M1, M3, M5, M9, M13, M17, M21, M25, M29, M33 (resulted prior to  receipt of IP), ED, 4WFU, LTFU   Time of Collection:   [] NA, assigned Female at birth or Not a POCBP  []                          [x]  Serum Pregnancy Test (POCBP only) only if urine test positive, perform a serum test to confirm.   Yes No/NA Study Drug Administration  []  [x]  CAB LA Administration:     Visit: M1        Needle Size: [] 1.5"  [] 2" (For BMI >/= 30)   There is no height or weight on file to calculate BMI. Gauge: [] 21G [] 22G [] 23G  Site: [] Left Ventrogluteal  [] Right Ventrogluteal  Time of Administration:    []  [x]  CAB ULA Administration:   Visit: M3, M5, M9, M13, M17, M21, M25, M29  Sites should be rotated between right and left Needle Size: [] 1.5"  [] 2" (For BMI >/= 30)   There is no height or weight on file to calculate BMI.  Gauge: [] 21G [] 22G [] 23G  Site: [] Left Ventrogluteal  [] Right Ventrogluteal  Time of Administration:    [x]  []  ISR Review completed, ISR photography necessary [] Yes[x] No     All Visits *Digital photographs will be documented at all visits (scheduled or unscheduled) on all participants who have an injection site reaction that is a visible, persistent Grade 2 (no improvement/resolution in >10 days), Grade >/=3, or serious. *Examination will include a physician/HCP assessment of pain (or tenderness), pruritus, warmth, infections, rash, erythema (or redness), swelling (or induration), and nodules (granulomas or cysts)  *Remote Visits: M6+2W, M7+2W, M8+2W, M10, M12, M15, M19, M23, M27, M31 should assess whether participants have any new ISR symptoms/signs since their last visit or have previously reported ISR that has not improved or is worsening, if present site should perform an  unscheduled visits ASAP for ISR assessment   Yes No/NA  Pharmacokinetic Sampling  [x]  []   Post Dose PK sampling: 2 hours after injection on dosing days(+/- 30 mins collection window Visits:1 week, M1, M1+1Wk, M2, M3, M3+1Wk, M4, M5, M5+1W, M6, M7, M8, M9, , M9+1Wk, M11, M13, M33,  ED, 4WFU, LTFU   Time of PK Collection: 0836    Yes No Completed:  [x]  []   HIV/STI  Counseling/Condom Distribution:    All Visits, except remote calls [] Accepted  [x] Declined   Yes No/NA Completed  []  [x]  Was there an ISR during remote phone call visit:  [] Yes []  No If yes, schedule unscheduled ISR assessment ASAP Date:   [x]  []  Instructed to contact site if ISR gets worse or does not improve after 10 days from the last ISR assessment.  [x]  []  Assess for Protocol Deviations  [x]  []  Reminders:  Participants of childbearing potential agree to use a highly effective method of contraception consistently and correctly HIV vaccines are not permitted at any time during the study No other experimental agents, antiretroviral drugs, cytotoxic chemotherapy, or radiation therapy my not be administered throughout the trial No systemically administered immunomodulator's permitted Notify study staff if you experience any signs/symptoms, visit provider in ER or scheduled evaluation, or start any new OTC, supplemental or prescription therapies   Yes No Completed:  [x]  []  Compensation:  You will receive a payment of $78.00 at the end of each non dosing visit completed to reimburse you for your time and effort.  You will receive $125.00 at the end of each dosing visit completed to reimburse you for your time and effort.   If you must return to the clinic for an unscheduled visit, you will receive $78.   Amount: $78   [x]  []  Schedule Next Visit: Visits should be planned using a calendar months and scheduled based on the date of the Day 1 injection.    Remote Call Visits schedule on M6+2 wk, M7+2wk, M8+2wk, M10, M12, M15, M19, M23, M27, M31 (Assess ISR, AES, Conmeds)  If transitioning to CAB 200mg /ml do not need LTFU visits, but do need ED visit and 4 week followup visit.  Date: 28Mar2025     Time: 0830   Version 2.0 06Mar2025 JLS

## 2023-06-29 NOTE — Research (Addendum)
 Alexis Hardin

## 2023-07-01 ENCOUNTER — Encounter: Payer: Self-pay | Admitting: Nurse Practitioner

## 2023-07-01 ENCOUNTER — Ambulatory Visit (INDEPENDENT_AMBULATORY_CARE_PROVIDER_SITE_OTHER): Payer: Self-pay | Admitting: Nurse Practitioner

## 2023-07-01 VITALS — BP 120/72 | HR 83 | Temp 98.5°F | Resp 16 | Ht 60.0 in | Wt 130.6 lb

## 2023-07-01 DIAGNOSIS — N3001 Acute cystitis with hematuria: Secondary | ICD-10-CM

## 2023-07-01 MED ORDER — PHENAZOPYRIDINE HCL 200 MG PO TABS: 200.0000 mg | ORAL_TABLET | Freq: Three times a day (TID) | ORAL | 0 refills | Status: DC | PRN

## 2023-07-01 MED ORDER — AMOXICILLIN 875 MG PO TABS: 875.0000 mg | ORAL_TABLET | Freq: Two times a day (BID) | ORAL | 0 refills | Status: AC

## 2023-07-01 NOTE — Progress Notes (Signed)
 [facility], [place] [address] [place], [place] [address]  Internal MEDICINE  Office Visit Note  Patient Name: Alexis Hardin  [id number]  [id number]  Date of Service: 07/01/2023  Chief Complaint  Patient presents with   Acute Visit    uti     HPI Alexis Hardin presents for an acute sick visit for UTI --onset of symptoms started in January, seen in ED, urinalysis was not definitive for UTI so she was not treated at the time. A urine culture was sent which results later on after the ED visit.  --reports blood in urine, back pain, urinary discomfort, pain with urination, urgency, frequency.  Urine culture from ED visit in January resulted in GBS S. Agalactiae and report states that it should be susceptible to penicillins.       Current Medication:  Outpatient Encounter Medications as of 07/01/2023  Medication Sig   amoxicillin (AMOXIL) 875 MG tablet Take 1 tablet (875 mg total) by mouth 2 (two) times daily for 10 days. Take with food.   Magnesium Glycinate 100 MG CAPS Take 50 mg by mouth daily.   Nicotinamide Riboside Chloride POWD 1 capsule by Does not apply route daily.   phenazopyridine (PYRIDIUM) 200 MG tablet Take 1 tablet (200 mg total) by mouth 3 (three) times daily as needed for pain.   RESVERATROL PO Take 1 capsule by mouth daily.   No facility-administered encounter medications on file as of 07/01/2023.      Medical History: Past Medical History:  Diagnosis Date   Anemia 10/06/2022   Bradycardia 2025   Asymptomatic   Conjunctivitis 10/26/2021   of the left eye, resolved 17Jul2023   Intermittent Low back pain 11/23/2020   Irritable bowel syndrome (IBS) 2023   Urinary tract infection 05/02/2022   resolved 17Jan2024     Vital Signs: BP 120/72   Pulse 83   Temp 98.5 F (36.9 C)   Resp 16   Ht 5' (1.524 m)   Wt 130 lb 9.6 oz (59.2 kg)   SpO2 98%   BMI 25.51 kg/m    Review of Systems  Constitutional:  Positive for fatigue. Negative for  chills and fever.  Respiratory:  Negative for cough, chest tightness, shortness of breath and wheezing.   Cardiovascular: Negative.  Negative for chest pain and palpitations.  Gastrointestinal:  Positive for abdominal distention and abdominal pain. Negative for nausea and vomiting.  Genitourinary:  Positive for dysuria, flank pain, frequency, hematuria and urgency.  Musculoskeletal:  Positive for back pain.    Physical Exam Vitals reviewed.  Constitutional:      General: She is not in acute distress.    Appearance: Normal appearance. She is normal weight. She is not ill-appearing.  HENT:     Head: Normocephalic and atraumatic.  Eyes:     Pupils: Pupils are equal, round, and reactive to light.  Cardiovascular:     Rate and Rhythm: Normal rate and regular rhythm.  Pulmonary:     Effort: Pulmonary effort is normal. No respiratory distress.  Abdominal:     Tenderness: There is abdominal tenderness in the suprapubic area. There is no right CVA tenderness or left CVA tenderness.  Neurological:     Mental Status: She is alert and oriented to person, place, and time.  Psychiatric:        Mood and Affect: Mood normal.        Behavior: Behavior normal.       Assessment/Plan: 1. Acute cystitis with hematuria (Primary)  Amoxicillin prescribed to treat UTI per urine culture done in January since she was never originally treated, pyridium prescribed to help with bladder spasms and pain. Urinalysis and urine culture specimens sent to lab today.  - amoxicillin (AMOXIL) 875 MG tablet; Take 1 tablet (875 mg total) by mouth 2 (two) times daily for 10 days. Take with food.  Dispense: 20 tablet; Refill: 0 - phenazopyridine (PYRIDIUM) 200 MG tablet; Take 1 tablet (200 mg total) by mouth 3 (three) times daily as needed for pain.  Dispense: 15 tablet; Refill: 0 - Urinalysis, Routine w reflex microscopic - CULTURE, URINE COMPREHENSIVE   General Counseling: Sunshyne verbalizes understanding of the  findings of todays visit and agrees with plan of treatment. I have discussed any further diagnostic evaluation that may be needed or ordered today. We also reviewed her medications today. she has been encouraged to call the office with any questions or concerns that should arise related to todays visit.    Counseling:    Orders Placed This Encounter  Procedures   CULTURE, URINE COMPREHENSIVE   Urinalysis, Routine w reflex microscopic    Meds ordered this encounter  Medications   amoxicillin (AMOXIL) 875 MG tablet    Sig: Take 1 tablet (875 mg total) by mouth 2 (two) times daily for 10 days. Take with food.    Dispense:  20 tablet    Refill:  0    Fill new script today   phenazopyridine (PYRIDIUM) 200 MG tablet    Sig: Take 1 tablet (200 mg total) by mouth 3 (three) times daily as needed for pain.    Dispense:  15 tablet    Refill:  0    Fill new script today    Return if symptoms worsen or fail to improve.  [place] Controlled Substance Database was reviewed by me for overdose risk score (ORS)  Time spent:20 Minutes Time spent with patient included reviewing progress notes, labs, imaging studies, and discussing plan for follow up.   This patient was seen by Sallyanne Kuster, FNP-C in collaboration with Dr. Beverely Risen as a part of collaborative care agreement.  Uriel Horkey R. Tedd Sias, MSN, FNP-C Internal Medicine

## 2023-07-02 LAB — MICROSCOPIC EXAMINATION
Bacteria, UA: NONE SEEN
Casts: NONE SEEN /LPF
Epithelial Cells (non renal): >10 /HPF — AB (ref 0–10)
RBC, Urine: NONE SEEN /HPF (ref 0–2)

## 2023-07-02 LAB — URINALYSIS, ROUTINE W REFLEX MICROSCOPIC
Glucose, UA: NEGATIVE
Ketones, UA: NEGATIVE
Nitrite, UA: POSITIVE — AB
RBC, UA: NEGATIVE
Specific Gravity, UA: 1.027 (ref 1.005–1.030)
Urobilinogen, Ur: 1 mg/dL (ref 0.2–1.0)
pH, UA: 5.5 (ref 5.0–7.5)

## 2023-07-13 ENCOUNTER — Other Ambulatory Visit: Payer: Self-pay

## 2023-07-13 VITALS — BP 100/63 | HR 63 | Wt 124.8 lb

## 2023-07-13 DIAGNOSIS — Z006 Encounter for examination for normal comparison and control in clinical research program: Secondary | ICD-10-CM

## 2023-07-13 LAB — POCT URINE PREGNANCY: Preg Test, Ur: NEGATIVE

## 2023-07-13 LAB — POCT RAPID HIV: Rapid HIV, POC: NEGATIVE

## 2023-07-13 MED ORDER — STUDY - EXTEND - CABOTEGRAVIR LA (GSK1265744) 600MG/3ML IM INJECTION (PI-VAN DAM)
600.0000 mg | INJECTION | Freq: Once | INTRAMUSCULAR | Status: AC
Start: 2023-07-13 — End: 2023-07-13
  Administered 2023-07-13: 600 mg via INTRAMUSCULAR
  Filled 2023-07-13: qty 3

## 2023-07-13 NOTE — Research (Signed)
 Alexis Hardin [id number]                                                  Protocol Amendment 02  [facility] Research Site:  [id number]                                                                  TREATMENT PHASE  Visit: []  Day1+1W     [x]  M1    []  M1+1W    []  M2    []  M3    []  M3+1W    []  M4    [] M5     [] M5+1W   [] M6   [] M6+2W   [] M7   [] M7+2W   [] M8   [] M8+2W   [] M9 [] M9+1W   [] M10   [] M11   [] M12   [] M13   [] M15    [] M17   [] M19   [] M21   [] M23   [] M25   [] M27   [] M29   [] M31   [] M33   [] ED  [] 4 wk followup (if not entering LTFU)   [] LTFU M1   [] LTFU M4   [] LTFU M8   [] LTFU M12     Date: 24Mar2025         4 Letter Code: MELO Subject ID: [id number]  Assessments should occur in the following order:  eCSSRs  All other questionnaires  Triplicate 12-lead ECG  Vital signs  Blood draws (clinical laboratory tests, pre-dose pharmacokinetics, etc)  IP injection  Post-dose pharmacokinetics (if required)  Yes No Completed:  [x]  []  Verify participant identity before doing any study procedures   All Visits  [x]  []  Verify correct version of ICF is signed   All Visits   Yes No Questionnaires Completed: Patient Reported Outcome Assessment must be completed before other assessments take place at each designated visit. Completed electronically by participants.  [x]  []  eCSSRs    Visits: M1, M3, M5, M9, M13, M17, M21, M25, M29, M33, ED Any positive  (abnormal) response indicating SIB or any unusual changes in behavior, confirmed by the investigator will result in their discontinuation of study intervention, the PI/SI will arrange for urgent specialist psychiatric evaluation and management  []  [x]  Social Determinants of Health Questionnaire (SDoH)     Visits: M17, ED  [x]  []  Study Medication Satisfaction Questionnaire (SMSQs)     Visits: M1, M3, M5, M9, M13, M17, M21, M25, M29, M33, ED  []  [x]  PrEP Preference and Rationale Questionnaire (only if prior exposure to oral PrEP)     Visits: M9, M17, M25, M33, ED  [x]  []   Acceptability of Injection Questionnaire     Visits: M1, M3, M5, M9, M13, M17, M21, M25, M29, M33, ED  [x]  []  Generalized Anxiety Disorders (GAD-&) Questionnaire     Visits: M1, M3, M5, M9, M13, M17, M21, M25, M29, M33, ED  []  [x]  HIV-risk and PrEP use Anxiety and Stigma Questionnaire prior to the study drug administration     Visits: M17, M29, M33, ED  [x]  []  Sexual Health Practices Questionnaire-administer prior to study drug administration Visits: M1, M3, M5, M9, M13, M17, M21, M25, M29, M33, ED  Yes No Completed:  [x]  []  Review Changes in Medical History    All Visits  [x]  []  Review Changes in Concomitant Medications    All Visits  [x]  []  Assess for AEs, SAEs: All AEs (from Day 1 onwards) and SAEs (from Screening onwards) must be recorded in eCRF at all visits and reported within required timelines.    All Visits  [x]  []  Pull Review in: Medication Review Medication Indication Dose/Route/Frequency Start Date End Date  Magnesium Glycinate Prophylaxis 50mg  PO QD 01Dec2024   Nicotinamide Riboside Chloride Powder Prophylaxis 1 Capsule QD 01Dec2024   Resveratrol Prophylaxis 1 Capsule QD 01Dec2024   Cephalexin Urinary Tract Infection 500 mg tab QD 06Mar2025 11Mar2025  Amoxicillin  Urinary Tract Infection 875 mg tab BID 12Mar2025 22Mar2025  phenazopyridine (PYRIDIUM)  Urinary Tract Infection 200 MG tablet TID 12Mar2025 17Mar2025   AE Review Diagnosis/Symptoms Indicate: NSAE SAE AESI Maximum Grade Related/Not Related to study intervention CAB LA or CAB ULA Action/Dose Change Start Date (Time, if available) End Date (Time, if available)  Left Ventrogluteal Injection Site Tenderness NSAE 1 Related to CAB LA No Change, No action taken 28Feb2025 02Mar2025  Urinary Tract Infection NSAE 2 Not Related to study intervention No change, no action taken 04Mar2025 22Mar2025                   Medical History Review  Diagnosis Grade Start Date End Date  Bradycardia, asymptomatic 1 Unk 2025    Irritable Bowel Syndrome 2 Unk 2023   Intermittent Low Back Pain 2 05Aug2022   Conjunctivitis of Left Eye 2 08Jul2023 17Jul2023  Urinary Tract Infection 2 12Jan2024 17Jan2024  Anemia 2 17Jun2024       [x]  []  Contraception Review: Report in [facility] Visits: M1, M2, M3, M4, M5, M6, M7, M8, M9, M11, M13, M17, M21,M25, M29, M31, M33, ED, 4WFU, LTFU Visits Female of childbearing potential: Must agree to use a highly effective method of contraception consistently and correctly, must also continue to use adequate contraception methods for at least 52 weeks after the last injection. Female condoms must be used in addition to hormonal contraception Sexual Abstinence: considered a highly effective method only if defined as refraining from heterosexual intercourse during the entire period of risk associated with the study intervention.  The reliability of sexual abstinence needs to be evaluated in relation to the duration of the study and the preferred and usual lifestyle of the participant.  1 partner- vasectomy   Yes No/NA Completed:  []  [x]  Premature cardiovascular disease: female participant <65 years or female participant <55 years in first degree relatives only.  Note changes since the start of the study.     Months 5, 13, 21, 29     [] No Changes    []  [x]  Substance Usage: (list name of substance, amount used, start and stop dates)  Months 5, 24, 24, 22 Social History   Substance and Sexual Activity  Alcohol Use Not Currently   Alcohol/week: 1.0 standard drink of alcohol   Types: 1 Glasses of wine per week    Social History   Substance and Sexual Activity  Drug Use Yes   Types: Marijuana   Comment: + on urine screen    Tobacco Use: Low Risk  (07/13/2023)   Patient History    Smoking Tobacco Use: Never    Smokeless Tobacco Use: Never    Passive Exposure: Never    Caffeine Use:  caffeine containing beverages/day  [x]  []  Triplicate 12-lead ECG  Visit: M1, M3, M5, M9, M29 Perform in a  semi-supine position after 5 minutes of rest.  3 individual ECG tracing should be obtained as closely as possible in succession, but no more than 2 minutes apart. The full set of triplicates should be completed in less than 6 minutes. If ECG abnormal clinically significant  then report as an AE/SAE.  Rest Start Time: 0920 ECG #1 start time: 0926    ECG #2: 0926    ECG #3: 0927   [x]  []  Vital Signs:  [x] Weight, BMI (after 5 mins in a semi-supine position)    Only on Visits: M1, M3, M5, M9, M13, M17, M21, M25, M29, M33, ED, 4W FU, LTFU Visits  [] Height    Visit: M9  [x] BP and Pulse (measured in a semi supine position after 5 minutes rest)     Visit: M1, M3, M5, M9, M13, M17, M21, M25, M29, M33, ED, 4WFU  [] Temperature and RR:    Visit: M13, ED  Rest Start Time: 0930  Vital Signs Start Time: 0935  Last Weight  Most recent update: 07/13/2023 10:23 AM    Weight  56.6 kg (124 lb 12.5 oz)             Body mass index is 24.37 kg/m.  Vitals:   07/13/23 0935  BP: 100/63  Pulse: 63     [x]  []  Brief Symptom Directed Physical Examination: Will include at a minimum, assessments of skin, lungs, CV, abdomen (liver and spleen). Any abnormalities must be noted in the CRF (e.g current medical conditions or AE logs).     Visit: M1, M3, M5, M6, M7, M8, M9, M11, M13, M17, M21, M25, M29, M33, ED, 4W FU, LTFU   Physical Exam Constitutional:      Appearance: Normal appearance.  Cardiovascular:     Rate and Rhythm: Normal rate and regular rhythm.  Pulmonary:     Effort: Pulmonary effort is normal.     Breath sounds: Normal breath sounds.  Abdominal:     General: Bowel sounds are normal.     Palpations: Abdomen is soft.     Tenderness: There is no abdominal tenderness.  Skin:    General: Skin is warm.  Neurological:     Mental Status: She is alert.  Psychiatric:        Mood and Affect: Mood normal.        Behavior: Behavior normal.        Thought Content: Thought content normal.         Judgment: Judgment normal.  Exam unremarkable, has recovered from UTI.    Yes No Blood Draw Tracking:  All Visits  [x]  []  Verified previous Hgb to be >= 10 (if value <10, adjust volume per unit SOP)                          [] NA, healthy volunteer, previous Hgb results are unavailable  [x]  []  Does participant report non-study blood collection in the previous 2 months?  [x] Yes, estimated vol. 16 mL    []  No  []  [x]  Assess Fasting Status.   Visits: M13, M33                                                      Is participant Fasting for at  least 6 hours, overnight fast preferred?  []  No   []  Yes, Date/Time of last meal?   [x]  []  Blood Drawn.     Problems?  [x]  No   [] Yes, why?   Time: 0939    Total Vol: 38 mL   Yes No Lab Collection:  [x]  []  RAPID HIV TESTING        Visit: M1, M3, M5, M9, M13, M17, M21, M25, M29, M33, ED, 4WFU  [] REACTIVE or [x] NON REACTIVE  If positive/reactive participant must be withdrawn from the study even if subsequent confirmatory testing is negative. If reactive participant referred for further testing and clinical management as per local standard of care  Is participant experiencing any signs or symptoms consistent with acute HIV infection  [] Yes [x] No   [x]  []  Chemistry     Visits: M1, M3, M4, M5, M6, M7,M8, M9, M11, M13, M17, M21, M25, M29, M33, ED, 4WFU, LTFU  [x]  []  Hematology     Visits: M1, M3, M4, M5, M6, M7, M8, M9, M11, M13, M17, M21, M25, M29, M33, ED, 4WFU, LTFU  [x]  []  Non rapid HIV immunoassay    Visits: M1, M3, M5, M9, M13, M17, M21, M25, M29, M33, ED, 4WFU LTFU  [x]  []  HIV-1 RNA     Visits: M1, M3, M5, M9, M13, M17, M21, M25, M29, M33, ED, 4WFU, LTFU  [x]  []  Neisseria gonorrhea (GC), Chlamydia trachomatis (CT), Trichomononas vaginalis (TV), syphilis.  If positive we will refer for treatment as per local guidelines.   Visit: M1, M3, M5, M9, M13, M17, M21, M25, M29, M33, ED, 4WFU, LTFU  PRN if symptomatic at other visits  Is participant  experiencing any symptomatic STIs currently? [] Yes [x] No  [x] GC/CT NAAT urine/vaginal swab  *Rectal and oropharyngeal swab can be collected based on reported sexual history. [x] GC and CT: Rectal Swab [x] Oropharyngeal Swab [x] TV: vaginal Swab  *If female and menstruating may take sample at next visit  [x]  []  Coagulation Tests     Visit: M13, M33  []  [x]  Hepatitis B&C     Visit: M13, M25, ED  []  [x]  Fasting Labs: Glucose     Visit: M13 and M33  (Insulin, HbA1c only collect if withdrawal visit occurs at M13 or M33)  [x]  []  Plasma for Storage     Visits: M1, M2, M3,M4, M5, M6, M7,M8, M9, M11, M13, M17, M21, M25, M29, ED, LTFU Visits  [x]  []  Predose: PK Sampling  Collect < 1 hr before injection Visits: M1, M3, M5, M9, M13, M17, M21, M25, M29  []  [x]  Illicit Drug Screen     Visits: M5, M13, M21, M29, M33  []  [x]  Urinalysis: Morning specimen is preferred     Visits: M13, M33, ED, 4WFU  Time of Collection:   LMP: Patient's last menstrual period was 07/09/2023 (exact date).  []  N/A   Yes No/NA Lab Collection, Cont.:  [x]  []  Urine Pregnancy Test (POCBP only)  Visits: M1, M3, M5, M9, M13, M17, M21, M25, M29, M33 (resulted prior to receipt of IP), ED, 4WFU, LTFU   Time of Collection: 0833    [] NA, assigned Female at birth or Not a POCBP  []                          [x]  Serum Pregnancy Test (POCBP only) only if urine test positive, perform a serum test to confirm.   Yes No/NA Study Drug Administration  [x]  []  CAB LA Administration:     Visit: M1  Needle Size: [x] 1.5"  [] 2" (For BMI >/= 30)   Body mass index is 24.37 kg/m. Gauge: [x] 21G [] 22G [] 23G  Site: [] Left Ventrogluteal  [x] Right Ventrogluteal  Lot Number: [id number]   Time of Administration: 1247    []  [x]  CAB ULA Administration:   Visit: M3, M5, M9, M13, M17, M21, M25, M29  Sites should be rotated between right and left Needle Size: [] 1.5"  [] 2" (For BMI >/= 30)   Body mass index is 24.37 kg/m.  Gauge: [] 21G [] 22G [] 23G  Site:  [] Left Ventrogluteal  [] Right Ventrogluteal  Time of Administration:    [x]  []  ISR Review completed, ISR photography necessary [] Yes[x] No     All Visits *Digital photographs will be documented at all visits (scheduled or unscheduled) on all participants who have an injection site reaction that is a visible, persistent Grade 2 (no improvement/resolution in >10 days), Grade >/=3, or serious. *Examination will include a physician/HCP assessment of pain (or tenderness), pruritus, warmth, infections, rash, erythema (or redness), swelling (or induration), and nodules (granulomas or cysts)  *Remote Visits: M6+2W, M7+2W, M8+2W, M10, M12, M15, M19, M23, M27, M31 should assess whether participants have any new ISR symptoms/signs since their last visit or have previously reported ISR that has not improved or is worsening, if present site should perform an unscheduled visits ASAP for ISR assessment   Yes No/NA  Pharmacokinetic Sampling  []  [x]   Post Dose PK sampling: 2 hours after injection on dosing days(+/- 30 mins collection window Visits:1 week, M1, M1+1Wk, M2, M3, M3+1Wk, M4, M5, M5+1W, M6, M7, M8, M9, , M9+1Wk, M11, M13, M33, ED, 4WFU, LTFU   Time of Injection: 1247  Time of PK Collection: X, unable to collect.    IP dispensation look longer than normal due to IT issue within [employer] system. PPT unable to return for postdose collect due to visit taking much longer than anticipated.    Yes No Completed:  [x]  []   HIV/STI  Counseling/Condom Distribution:    All Visits, except remote calls [x] Accepted  [] Declined   Yes No/NA Completed  []  [x]  Was there an ISR during remote phone call visit:  [] Yes []  No If yes, schedule unscheduled ISR assessment ASAP Date:   [x]  []  Instructed to contact site if ISR gets worse or does not improve after 10 days from the last ISR assessment.  [x]  []  Assess for Protocol Deviations  [x]  []  Reminders:  Participants of childbearing potential agree to use a highly  effective method of contraception consistently and correctly HIV vaccines are not permitted at any time during the study No other experimental agents, antiretroviral drugs, cytotoxic chemotherapy, or radiation therapy my not be administered throughout the trial No systemically administered immunomodulator's permitted Notify study staff if you experience any signs/symptoms, visit provider in ER or scheduled evaluation, or start any new OTC, supplemental or prescription therapies   Yes No Completed:  [x]  []  Compensation:  You will receive a payment of $78.00 at the end of each non dosing visit completed to reimburse you for your time and effort.  You will receive $125.00 at the end of each dosing visit completed to reimburse you for your time and effort.   If you must return to the clinic for an unscheduled visit, you will receive $78.   Amount: $125   [x]  []  Schedule Next Visit: Visits should be planned using a calendar months and scheduled based on the date of the Day 1 injection.    Remote Call Visits schedule on M6+2 wk,  M7+2wk, M8+2wk, M10, M12, M15, M19, M23, M27, M31 (Assess ISR, AES, Conmeds)  If transitioning to CAB 200mg /ml do not need LTFU visits, but do need ED visit and 4 week followup visit.  Date: 04Apr2025     Time: 0830   Version 2.0 06Mar2025 JLS

## 2023-07-17 ENCOUNTER — Encounter: Payer: Self-pay | Admitting: Physician Assistant

## 2023-07-17 ENCOUNTER — Ambulatory Visit (INDEPENDENT_AMBULATORY_CARE_PROVIDER_SITE_OTHER): Admitting: Physician Assistant

## 2023-07-17 VITALS — BP 110/70 | HR 76 | Temp 97.9°F | Resp 16 | Ht 60.0 in | Wt 129.2 lb

## 2023-07-17 DIAGNOSIS — R300 Dysuria: Secondary | ICD-10-CM | POA: Diagnosis not present

## 2023-07-17 DIAGNOSIS — K589 Irritable bowel syndrome without diarrhea: Secondary | ICD-10-CM

## 2023-07-17 DIAGNOSIS — N390 Urinary tract infection, site not specified: Secondary | ICD-10-CM | POA: Diagnosis not present

## 2023-07-17 DIAGNOSIS — R319 Hematuria, unspecified: Secondary | ICD-10-CM

## 2023-07-17 LAB — POCT URINALYSIS DIPSTICK
Bilirubin, UA: NEGATIVE
Blood, UA: POSITIVE
Glucose, UA: NEGATIVE
Ketones, UA: POSITIVE
Nitrite, UA: NEGATIVE
Protein, UA: NEGATIVE
Spec Grav, UA: 1.01 (ref 1.010–1.025)
Urobilinogen, UA: 0.2 U/dL
pH, UA: 5 (ref 5.0–8.0)

## 2023-07-17 MED ORDER — CIPROFLOXACIN HCL 500 MG PO TABS: 500.0000 mg | ORAL_TABLET | Freq: Two times a day (BID) | ORAL | 0 refills | Status: DC

## 2023-07-17 NOTE — Progress Notes (Signed)
 [facility] [address] [place], [place] [address]  Internal MEDICINE  Office Visit Note  Patient Name: Alexis Hardin  [id number]  [id number]  Date of Service: 07/17/2023  Chief Complaint  Patient presents with   Acute Visit   Recurrent UTI    UTI has not been fully treated. Having lower back pain, urine frequency. In [place], given Cipro in the past, it has worked well. Has not been prescribed Cipro here, patient would prefer to try this.     HPI Pt is here for a sick visit. -Initially had abdominal pain in Jan and went to ED. She was given Bentyl. Drinking water and pineapple and was limiting food intake initially and symptoms improved some, but came back again -Her urine did end up growing GBS so when she was seen for UTI symptoms in office a few weeks ago she was treated with amoxicillin but states this did not improve symptoms -Lower right side back/flank pain and some abdominal discomfort and cramping. Some bloating as well. -Does have IBS hx, not seeing GI -states hx of these symptoms and has responded well to Cipro in past when treated in [place] -Denies any hx of diverticulitis, however has not had imaging or colonoscopy (did cologuard). Discussed her symptoms do seem consistent with possible diverticulitis as well and would explain why cipro usually helps, though could still be her IBS with UTI -Will treat with cipro and send urine culture, but advised on diet recommendations and will refer to GI  Current Medication:  Outpatient Encounter Medications as of 07/17/2023  Medication Sig   ciprofloxacin (CIPRO) 500 MG tablet Take 1 tablet (500 mg total) by mouth 2 (two) times daily for 7 days.   Magnesium Glycinate 100 MG CAPS Take 50 mg by mouth daily.   Nicotinamide Riboside Chloride POWD 1 capsule by Does not apply route daily.   phenazopyridine (PYRIDIUM) 200 MG tablet Take 1 tablet (200 mg total) by mouth 3 (three) times daily as needed for  pain.   RESVERATROL PO Take 1 capsule by mouth daily.   No facility-administered encounter medications on file as of 07/17/2023.      Medical History: Past Medical History:  Diagnosis Date   Anemia 10/06/2022   Bradycardia 2025   Asymptomatic   Conjunctivitis 10/26/2021   of the left eye, resolved 17Jul2023   Intermittent Low back pain 11/23/2020   Irritable bowel syndrome (IBS) 2023   Urinary tract infection 05/02/2022   resolved 17Jan2024     Vital Signs: BP 110/70   Pulse 76   Temp 97.9 F (36.6 C)   Resp 16   Ht 5' (1.524 m)   Wt 129 lb 3.2 oz (58.6 kg)   LMP 07/09/2023 (Exact Date)   SpO2 98%   BMI 25.23 kg/m    Review of Systems  Constitutional:  Negative for fatigue and fever.  HENT:  Negative for congestion, mouth sores and postnasal drip.   Respiratory:  Negative for cough.   Cardiovascular:  Negative for chest pain.  Gastrointestinal:  Positive for abdominal distention and abdominal pain.  Genitourinary:  Positive for dysuria and flank pain.  Psychiatric/Behavioral: Negative.      Physical Exam Vitals reviewed.  Constitutional:      General: She is not in acute distress.    Appearance: Normal appearance. She is normal weight. She is not ill-appearing.  HENT:     Head: Normocephalic and atraumatic.  Eyes:     Pupils: Pupils are equal, round,  and reactive to light.  Cardiovascular:     Rate and Rhythm: Normal rate and regular rhythm.  Pulmonary:     Effort: Pulmonary effort is normal. No respiratory distress.  Abdominal:     Tenderness: There is abdominal tenderness in the suprapubic area. There is no right CVA tenderness or left CVA tenderness.  Neurological:     Mental Status: She is alert and oriented to person, place, and time.  Psychiatric:        Mood and Affect: Mood normal.        Behavior: Behavior normal.       Assessment/Plan: 1. Urinary tract infection with hematuria, site unspecified (Primary) Will treat with cipro and  adjust based on c/s - CULTURE, URINE COMPREHENSIVE - ciprofloxacin (CIPRO) 500 MG tablet; Take 1 tablet (500 mg total) by mouth 2 (two) times daily for 7 days.  Dispense: 14 tablet; Refill: 0  2. Dysuria - POCT Urinalysis Dipstick  3. Irritable bowel syndrome, unspecified type Hx of IBS but also may be having diverticulitis flares. Will refer to GI - Ambulatory referral to Gastroenterology   General Counseling: Burnice verbalizes understanding of the findings of todays visit and agrees with plan of treatment. I have discussed any further diagnostic evaluation that may be needed or ordered today. We also reviewed her medications today. she has been encouraged to call the office with any questions or concerns that should arise related to todays visit.    Counseling:    Orders Placed This Encounter  Procedures   CULTURE, URINE COMPREHENSIVE   Ambulatory referral to Gastroenterology   POCT Urinalysis Dipstick    Meds ordered this encounter  Medications   ciprofloxacin (CIPRO) 500 MG tablet    Sig: Take 1 tablet (500 mg total) by mouth 2 (two) times daily for 7 days.    Dispense:  14 tablet    Refill:  0    Time spent:30 Minutes

## 2023-07-17 NOTE — Research (Signed)
 Marland Kitchen

## 2023-07-18 ENCOUNTER — Emergency Department

## 2023-07-18 ENCOUNTER — Emergency Department
Admission: EM | Admit: 2023-07-18 | Discharge: 2023-07-18 | Disposition: A | Discharge status: Home / Self Care | Attending: Emergency Medicine | Admitting: Emergency Medicine

## 2023-07-18 ENCOUNTER — Encounter: Payer: Self-pay | Admitting: Emergency Medicine

## 2023-07-18 ENCOUNTER — Other Ambulatory Visit: Payer: Self-pay

## 2023-07-18 DIAGNOSIS — R300 Dysuria: Secondary | ICD-10-CM | POA: Insufficient documentation

## 2023-07-18 DIAGNOSIS — R42 Dizziness and giddiness: Secondary | ICD-10-CM | POA: Insufficient documentation

## 2023-07-18 DIAGNOSIS — D259 Leiomyoma of uterus, unspecified: Secondary | ICD-10-CM | POA: Diagnosis not present

## 2023-07-18 DIAGNOSIS — K449 Diaphragmatic hernia without obstruction or gangrene: Secondary | ICD-10-CM | POA: Insufficient documentation

## 2023-07-18 DIAGNOSIS — R1012 Left upper quadrant pain: Secondary | ICD-10-CM | POA: Diagnosis present

## 2023-07-18 DIAGNOSIS — R110 Nausea: Secondary | ICD-10-CM | POA: Diagnosis not present

## 2023-07-18 DIAGNOSIS — R109 Unspecified abdominal pain: Secondary | ICD-10-CM

## 2023-07-18 LAB — URINALYSIS, W/ REFLEX TO CULTURE (INFECTION SUSPECTED)
Bilirubin Urine: NEGATIVE
Glucose, UA: NEGATIVE mg/dL
Hgb urine dipstick: NEGATIVE
Ketones, ur: NEGATIVE mg/dL
Leukocytes,Ua: NEGATIVE
Nitrite: NEGATIVE
Protein, ur: NEGATIVE mg/dL
Specific Gravity, Urine: 1.024 (ref 1.005–1.030)
pH: 6 (ref 5.0–8.0)

## 2023-07-18 LAB — LIPASE, BLOOD: Lipase: 32 U/L (ref 11–51)

## 2023-07-18 LAB — COMPREHENSIVE METABOLIC PANEL WITH GFR
ALT: 13 U/L (ref 0–44)
AST: 18 U/L (ref 15–41)
Albumin: 3.9 g/dL (ref 3.5–5.0)
Alkaline Phosphatase: 52 U/L (ref 38–126)
Anion gap: 8 (ref 5–15)
BUN: 16 mg/dL (ref 6–20)
CO2: 23 mmol/L (ref 22–32)
Calcium: 9.3 mg/dL (ref 8.9–10.3)
Chloride: 107 mmol/L (ref 98–111)
Creatinine, Ser: 0.77 mg/dL (ref 0.44–1.00)
GFR, Estimated: >60 mL/min (ref >60)
Glucose, Bld: 82 mg/dL (ref 70–99)
Potassium: 3.6 mmol/L (ref 3.5–5.1)
Sodium: 138 mmol/L (ref 135–145)
Total Bilirubin: 0.6 mg/dL (ref 0.0–1.2)
Total Protein: 7.4 g/dL (ref 6.5–8.1)

## 2023-07-18 LAB — CBC WITH DIFFERENTIAL/PLATELET
Abs Immature Granulocytes: 0.01 10*3/uL (ref 0.00–0.07)
Basophils Absolute: 0.1 10*3/uL (ref 0.0–0.1)
Basophils Relative: 1 %
Eosinophils Absolute: 0.1 10*3/uL (ref 0.0–0.5)
Eosinophils Relative: 1 %
HCT: 38.8 % (ref 36.0–46.0)
Hemoglobin: 12.5 g/dL (ref 12.0–15.0)
Immature Granulocytes: 0 %
Lymphocytes Relative: 31 %
Lymphs Abs: 1.5 10*3/uL (ref 0.7–4.0)
MCH: 26.4 pg (ref 26.0–34.0)
MCHC: 32.2 g/dL (ref 30.0–36.0)
MCV: 81.9 fL (ref 80.0–100.0)
Monocytes Absolute: 0.5 10*3/uL (ref 0.1–1.0)
Monocytes Relative: 10 %
Neutro Abs: 2.8 10*3/uL (ref 1.7–7.7)
Neutrophils Relative %: 57 %
Platelets: 248 10*3/uL (ref 150–400)
RBC: 4.74 MIL/uL (ref 3.87–5.11)
RDW: 14.1 % (ref 11.5–15.5)
WBC: 4.9 10*3/uL (ref 4.0–10.5)
nRBC: 0 % (ref 0.0–0.2)

## 2023-07-18 LAB — POC URINE PREG, ED: Preg Test, Ur: NEGATIVE

## 2023-07-18 LAB — TROPONIN I (HIGH SENSITIVITY): Troponin I (High Sensitivity): <2 ng/L (ref <18)

## 2023-07-18 LAB — PREGNANCY, URINE: Preg Test, Ur: NEGATIVE

## 2023-07-18 LAB — LACTIC ACID, PLASMA: Lactic Acid, Venous: 0.8 mmol/L (ref 0.5–1.9)

## 2023-07-18 MED ORDER — ALUM & MAG HYDROXIDE-SIMETH 200-200-20 MG/5ML PO SUSP
30.0000 mL | Freq: Once | ORAL | Status: AC
Start: 2023-07-18 — End: 2023-07-18
  Administered 2023-07-18: 30 mL via ORAL
  Filled 2023-07-18: qty 30

## 2023-07-18 MED ORDER — IOHEXOL 9 MG/ML PO SOLN
500.0000 mL | ORAL | Status: DC
Start: 2023-07-18 — End: 2023-07-18
  Administered 2023-07-18: 500 mL via ORAL

## 2023-07-18 MED ORDER — LACTATED RINGERS IV BOLUS
1000.0000 mL | Freq: Once | INTRAVENOUS | Status: AC
  Administered 2023-07-18: 1000 mL via INTRAVENOUS

## 2023-07-18 MED ORDER — LIDOCAINE VISCOUS HCL 2 % MT SOLN
15.0000 mL | Freq: Once | OROMUCOSAL | Status: AC
  Administered 2023-07-18: 15 mL via ORAL
  Filled 2023-07-18: qty 15

## 2023-07-18 MED ORDER — FAMOTIDINE 20 MG PO TABS: 20.0000 mg | ORAL_TABLET | Freq: Two times a day (BID) | ORAL | 0 refills | Status: DC

## 2023-07-18 MED ORDER — IOHEXOL 300 MG/ML  SOLN
80.0000 mL | Freq: Once | INTRAMUSCULAR | Status: AC | PRN
  Administered 2023-07-18: 80 mL via INTRAVENOUS

## 2023-07-18 MED ORDER — KETOROLAC TROMETHAMINE 15 MG/ML IJ SOLN
15.0000 mg | Freq: Once | INTRAMUSCULAR | Status: AC
  Administered 2023-07-18: 15 mg via INTRAVENOUS
  Filled 2023-07-18: qty 1

## 2023-07-18 MED ORDER — ONDANSETRON HCL 4 MG/2ML IJ SOLN
4.0000 mg | Freq: Once | INTRAMUSCULAR | Status: AC
  Administered 2023-07-18: 4 mg via INTRAVENOUS
  Filled 2023-07-18: qty 2

## 2023-07-18 NOTE — ED Triage Notes (Addendum)
 First Nurse Note:  Pt via POV from [facility]. Pt c/o LUQ abd pain and generalized lower abd pain or the past 2 days. Pt was seen at [facility] yesterday dx with UTI and started on abx. Reports that her UTI symptoms are better but pain is getting worse, also endorses NV. States she feels like she has had a increase in flatulence. Pt has a hx of gastric sleeve. VSS with BP 94/61 per KC. Pt is A&Ox4 and NAD, ambulatory from [facility].

## 2023-07-18 NOTE — ED Provider Notes (Signed)
 [facility] Provider Note    Event Date/Time   First MD Initiated Contact with Patient 07/18/23 1016     (approximate)   History   Abdominal Pain   HPI  Alexis Hardin is a 48 y.o. female with history of IBS, history of prior gastric sleeve, recurrent UTI, presenting with left upper quadrant, epigastric, left lower quadrant and suprapubic abdominal pain.  States that she is on ciprofloxacin currently for urinary tract infection.  Does still have some burning with urination but no hematuria or fever.  States that she has history of IBS, feels some bloating, last bowel movement was 2 days ago and solid.  Is passing gas.  Feels a bit of nausea as well as lightheadedness.  States that she has been unable to tolerate p.o. for the last 2 days because of the nausea.  States that she does have suprapubic pain on and off typically but that the pain that goes to her left lower quadrant left upper quadrant epigastric is new.  No trauma or falls.  Does also state that her blood pressures tend to run low, she does not know the exact number but states that she tends to be around systolic of 100.  On intermittent chart review, she was seen by her doctor yesterday for possible UTI, did have GBS growing in her urine culture previously and was treated with amoxicillin several weeks ago.  Was complaining about right flank pain and some abdominal cramping during that visit.  Was discharged from the visit with ciprofloxacin.     Physical Exam   Triage Vital Signs: ED Triage Vitals  Encounter Vitals Group     BP 07/18/23 1013 (!) 102/90     Systolic BP Percentile --      Diastolic BP Percentile --      Pulse Rate 07/18/23 1013 74     Resp 07/18/23 1013 18     Temp 07/18/23 1014 98.6 F (37 C)     Temp Source 07/18/23 1013 Oral     SpO2 07/18/23 1013 95 %     Weight 07/18/23 1013 127 lb (57.6 kg)     Height 07/18/23 1013 5' (1.524 m)     Head Circumference --       Peak Flow --      Pain Score 07/18/23 1012 10     Pain Loc --      Pain Education --      Exclude from Growth Chart --     Most recent vital signs: Vitals:   07/18/23 1013 07/18/23 1014  BP: (!) 102/90   Pulse: 74   Resp: 18   Temp:  98.6 F (37 C)  SpO2: 95%      General: Awake, no distress.  CV:  Good peripheral perfusion.  Resp:  Normal effort.  Not tachypneic, no respiratory distress Abd:  No distention.  Abdomen is soft, nontender in the right upper or right lower quadrant, she does have some tenderness to the epigastric, left upper quadrant, left lower quadrant and suprapubic region. Other:  No CVA tenderness.   ED Results / Procedures / Treatments   Labs (all labs ordered are listed, but only abnormal results are displayed) Labs Reviewed  URINALYSIS, W/ REFLEX TO CULTURE (INFECTION SUSPECTED) - Abnormal; Notable for the following components:      Result Value   Color, Urine AMBER (*)    APPearance CLOUDY (*)    Bacteria, UA RARE (*)  All other components within normal limits  COMPREHENSIVE METABOLIC PANEL WITH GFR  LIPASE, BLOOD  LACTIC ACID, PLASMA  CBC WITH DIFFERENTIAL/PLATELET  PREGNANCY, URINE  LACTIC ACID, PLASMA  POC URINE PREG, ED  TROPONIN I (HIGH SENSITIVITY)  TROPONIN I (HIGH SENSITIVITY)     EKG  EKG shows sinus rhythm, rate 59, normal QRS, normal QTc, no ischemic ST elevation, T wave flattening in V3, no prior to compare   RADIOLOGY CT imaging on my interpretation without obvious free air.   PROCEDURES:  Critical Care performed: No  Procedures   MEDICATIONS ORDERED IN ED: Medications  iohexol (OMNIPAQUE) 9 MG/ML oral solution 500 mL (500 mLs Oral Contrast Given 07/18/23 1120)  alum & mag hydroxide-simeth (MAALOX/MYLANTA) 200-200-20 MG/5ML suspension 30 mL (has no administration in time range)    And  lidocaine (XYLOCAINE) 2 % viscous mouth solution 15 mL (has no administration in time range)  lactated ringers bolus 1,000 mL  (1,000 mLs Intravenous New Bag/Given 07/18/23 1036)  ondansetron (ZOFRAN) injection 4 mg (4 mg Intravenous Given 07/18/23 1053)  ketorolac (TORADOL) 15 MG/ML injection 15 mg (15 mg Intravenous Given 07/18/23 1053)  iohexol (OMNIPAQUE) 300 MG/ML solution 80 mL (80 mLs Intravenous Contrast Given 07/18/23 1119)     IMPRESSION / MDM / ASSESSMENT AND PLAN / ED COURSE  I reviewed the triage vital signs and the nursing notes.                              Differential diagnosis includes, but is not limited to, UTI, diverticulitis, colitis, gastroenteritis, dehydration, electrolyte derangements, IBS, SBO, partial SBO.  Will get labs, CT abdomen pelvis, UA.  Will give her some IV fluids.  Zofran, IV Toradol.  Patient's presentation is most consistent with acute presentation with potential threat to life or bodily function.  Independent review of labs imaging are below.  On reassessment patient is feeling significantly better.  Discussed that her left upper quadrant abdominal pain could be secondary to acid reflux and will give her a GI cocktail here.  Also discussed laboratory findings as well as CT findings with patient including incidental findings.  Shared decision making done with patient and boyfriend, they are agreeable plan for discharge, will give her number to call to follow-up with OB/GYN for uterine fibroid.  Also recommended Pepcid that she can take for acid reflux.  She does have GI follow-up.  Considered but otherwise no indication for inpatient admission at this time, she is safe for outpatient management.  Will discharge her strict precautions.  Clinical Course as of 07/18/23 1224  Sat Jul 18, 2023  1120 Independent review of labs, pregnancy test is negative, troponins not elevated, lactate is normal, electrolyte severely deranged, creatinine is normal, LFTs are normal, no leukocytosis, lipase is normal. [TT]  1214 CT ABDOMEN PELVIS W CONTRAST IMPRESSION: 1. No acute inflammatory process  identified within the abdomen or pelvis. 2. Multiple other nonacute observations, as described above.   [TT]  1214 Urinalysis, w/ Reflex to Culture (Infection Suspected) -Urine, Clean Catch(!) No leuk esterase or nitrites, WBCs are 6-10, rare bacteria.  Patient is already on ciprofloxacin. [TT]    Clinical Course User Index [TT] Jodie Echevaria Franchot Erichsen, MD     FINAL CLINICAL IMPRESSION(S) / ED DIAGNOSES   Final diagnoses:  Abdominal pain, unspecified abdominal location  Nausea  Dysuria  Lightheadedness  Uterine leiomyoma, unspecified location  Hiatal hernia     Rx / DC Orders  ED Discharge Orders          Ordered    famotidine (PEPCID) 20 MG tablet  2 times daily        07/18/23 1220             Note:  This document was prepared using Dragon voice recognition software and may include unintentional dictation errors.    Claybon Jabs, MD 07/18/23 1224

## 2023-07-18 NOTE — Discharge Instructions (Addendum)
 You can take 650 mg of Tylenol or 400 mg of ibuprofen as needed for pain associated with your uterine fibroid.  I have also given you the number to call to follow-up with OB/GYN for uterine fibroid.  Please complete antibiotic course for UTI.

## 2023-07-20 ENCOUNTER — Ambulatory Visit: Admitting: Physician Assistant

## 2023-07-21 ENCOUNTER — Telehealth: Payer: Self-pay

## 2023-07-21 NOTE — Telephone Encounter (Signed)
 Patient was notified.

## 2023-07-22 ENCOUNTER — Telehealth: Payer: Self-pay

## 2023-07-22 ENCOUNTER — Other Ambulatory Visit: Payer: Self-pay

## 2023-07-22 LAB — CULTURE, URINE COMPREHENSIVE

## 2023-07-22 MED ORDER — NITROFURANTOIN MONOHYD MACRO 100 MG PO CAPS: 100.0000 mg | ORAL_CAPSULE | Freq: Two times a day (BID) | ORAL | 0 refills | Status: DC

## 2023-07-22 NOTE — Progress Notes (Signed)
 I have reviewed the participants labs and the chart below.

## 2023-07-22 NOTE — Progress Notes (Signed)
 I have reviewed all of the participants labs and chart for this visit.

## 2023-07-22 NOTE — Progress Notes (Signed)
 I have reviewed all of the patients labs and the chart below.

## 2023-07-22 NOTE — Telephone Encounter (Signed)
-----   Message from Carlean Jews sent at 07/22/2023  1:40 PM EDT ----- Please let her know that her UTI results shows resistance to cipro, please send macrobid for her to take instead

## 2023-07-22 NOTE — Telephone Encounter (Signed)
 Pt advised that she is resistance to Cipro so stopped that and we sent Macrobid for 10 days

## 2023-07-23 ENCOUNTER — Encounter

## 2023-07-23 ENCOUNTER — Other Ambulatory Visit: Payer: Self-pay

## 2023-07-23 DIAGNOSIS — Z006 Encounter for examination for normal comparison and control in clinical research program: Secondary | ICD-10-CM

## 2023-07-23 NOTE — Research (Addendum)
 Alexis Hardin [id number]                                                  Protocol Amendment 02  [facility]:  [phone]                                                                  TREATMENT PHASE  Visit: []  Day1+1W     []  M1    [x]  M1+1W    []  M2    []  M3    []  M3+1W    []  M4    [] M5     [] M5+1W   [] M6   [] M6+2W   [] M7   [] M7+2W   [] M8   [] M8+2W   [] M9 [] M9+1W   [] M10   [] M11   [] M12   [] M13   [] M15    [] M17   [] M19   [] M21   [] M23   [] M25   [] M27   [] M29   [] M31   [] M33   [] ED  [] 4 wk followup (if not entering LTFU)   [] LTFU M1   [] LTFU M4   [] LTFU M8   [] LTFU M12     Date: 3APR2025         4 Letter Code: MELO Subject ID: [id number]  Assessments should occur in the following order:  eCSSRs  All other questionnaires  Triplicate 12-lead ECG  Vital signs  Blood draws (clinical laboratory tests, pre-dose pharmacokinetics, etc)  IP injection  Post-dose pharmacokinetics (if required)  Yes No Completed:  [x]  []  Verify participant identity before doing any study procedures   All Visits  [x]  []  Verify correct version of ICF is signed   All Visits   Yes No Questionnaires Completed: Patient Reported Outcome Assessment must be completed before other assessments take place at each designated visit. Completed electronically by participants.  []  [x]  eCSSRs    Visits: M1, M3, M5, M9, M13, M17, M21, M25, M29, M33, ED Any positive  (abnormal) response indicating SIB or any unusual changes in behavior, confirmed by the investigator will result in their discontinuation of study intervention, the PI/SI will arrange for urgent specialist psychiatric evaluation and management  []  [x]  Social Determinants of Health Questionnaire (SDoH)     Visits: M17, ED  []  [x]  Study Medication Satisfaction Questionnaire (SMSQs)     Visits: M1, M3, M5, M9, M13, M17, M21, M25, M29, M33, ED  []  [x]  PrEP Preference and Rationale Questionnaire (only if prior exposure to oral PrEP)     Visits: M9, M17, M25, M33, ED  []  [x]  Acceptability  of Injection Questionnaire     Visits: M1, M3, M5, M9, M13, M17, M21, M25, M29, M33, ED  []  [x]  Generalized Anxiety Disorders (GAD-&) Questionnaire     Visits: M1, M3, M5, M9, M13, M17, M21, M25, M29, M33, ED  []  [x]  HIV-risk and PrEP use Anxiety and Stigma Questionnaire prior to the study drug administration     Visits: M17, M29, M33, ED  []  [x]  Sexual Health Practices Questionnaire-administer prior to study drug administration Visits: M1, M3, M5, M9, M13, M17, M21, M25, M29, M33, ED  Yes No Completed:  [x]  []  Review Changes in Medical History    All Visits  [x]  []  Review Changes in Concomitant Medications    All Visits   [x]  []  Assess for AEs, SAEs: All AEs (from Day 1 onwards) and SAEs (from Screening onwards) must be recorded in eCRF at all visits and reported within required timelines.    All Visits  [x]  []  Pull Review in:  Medication Review Medication Indication Dose/Route/Frequency Start Date End Date  Magnesium Glycinate Prophylaxis 50mg  PO QD 01Dec2024 31MAR2025  Nicotinamide Riboside Chloride Powder Prophylaxis 1 Capsule QD 01Dec2024 31MAR2025  Resveratrol Prophylaxis 1 Capsule QD 01Dec2024 31MAR2025  Cephalexin Urinary Tract Infection 500 mg tab QD 06Mar2025 11Mar2025  Amoxicillin   Urinary Tract Infection 875 mg tab BID 12Mar2025 22Mar2025  phenazopyridine  (PYRIDIUM )  Urinary Tract Infection 200 MG tablet TID 12Mar2025 17Mar2025  phenazopyridine  (PYRIDIUM )  Urinary Tract Infection 200 MG tablet TID 28mar2025   Ciprofloxacin  (Cipro ) Urinary tract infection 500 mg PO BID 28MAR2025 02APR2025  nitrofurantoin , macrocrystal-monohydrate, (MACROBID )  Urinary Tract Infection 100 mg PO BID  02APR2025   famotidine  (PEPCID )  nausea 20 mg PO BID 29MAR2025   iohexol  (OMNIPAQUE ) 9 MG/ML Oral contrast Abdominal pain  oral solution 500 mL once 29MAR2025 29MAR2025  alum & mag hydroxide-simeth (MAALOX/MYLANTA)  200-200-20 MG/5ML  nausea Oral suspension 30 mL once 29MAR2025 29MAR2025  lidocaine   (XYLOCAINE ) 2 %  nausea 15 mL PO once 29MAR2025 29MAR2025  lactated ringers  bolus 1,000 mL  lightheadedness 1,000 mL Intravenous once 29MAR2025 29MAR2025  ondansetron  (ZOFRAN )   nausea 4 mg IV once  29MAR2025 29MAR2025  ketorolac  (TORADOL )  Abdominal pain 15 mg Intravenous once 29MAR2025 29MAR2025  iohexol  (OMNIPAQUE ) 300 MG/ML Intravenous Contrast  Abdominal pain  80 mL IV once 29MAR2025 29MAR2025   AE Review Diagnosis/Symptoms Indicate: NSAE SAE AESI Maximum Grade Related/Not Related to study intervention CAB LA or CAB ULA Action/Dose Change Start Date (Time, if available) End Date (Time, if available)  Right Ventrogluteal Injection Site Tenderness NSAE AESI 1 Related to CAB LA No Change, No action taken 28Feb2025 02Mar2025  Urinary Tract Infection NSAE 2 Not Related to study intervention No change, no action taken 04Mar2025 22Mar2025  Urinary tract infection NSAE 2 Not related to study intervention No change, no action taken 28MAR2025   Abdominal pain NSAE 2 Not related to study intervention No change, no action taken 29MAR2025   Nausea NSAE 2 Not related to study intervention No change, no action taken 29MAR2025   Vomiting NSAE 2 Not related to study intervention No change, no action taken 29MAR2025   Lightheadedness NSAE 2 Not related to study intervention No change, no action taken 29MAR2025 30MAR2025  Hiatal hernia NSAE 2 Not related to study intervention No change, no action taken 29MAR2025   Uterine leiomyoma, unspecified location  NSAE 2 Not related to study intervention No change, no action taken 29MAR2025    Medical History Review  Diagnosis Grade Start Date End Date  Bradycardia, asymptomatic 1 Unk 2025   Irritable Bowel Syndrome 2 Unk 2023   Intermittent Low Back Pain 2 05Aug2022   Conjunctivitis of Left Eye 2 08Jul2023 17Jul2023  Urinary Tract Infection 2 12Jan2024 17Jan2024  Anemia 2 17Jun2024      [x]  []  Contraception Review: Report in [facility] Visits: M1, M2, M3, M4,  M5, M6, M7, M8, M9, M11, M13, M17, M21,M25, M29, M31, M33, ED, 4WFU, LTFU Visits Female of childbearing potential: Must agree to use a highly effective method of contraception consistently and correctly, must also  continue to use adequate contraception methods for at least 52 weeks after the last injection. Female condoms must be used in addition to hormonal contraception Sexual Abstinence: considered a highly effective method only if defined as refraining from heterosexual intercourse during the entire period of risk associated with the study intervention.  The reliability of sexual abstinence needs to be evaluated in relation to the duration of the study and the preferred and usual lifestyle of the participant.   Yes No/NA Completed:  []  [x]  Premature cardiovascular disease: female participant <65 years or female participant <55 years in first degree relatives only.  Note changes since the start of the study.     Months 5, 13, 21, 29     [] No Changes    []  [x]  Substance Usage: (list name of substance, amount used, start and stop dates)  Months 5, 63, 49, 74 Social History   Substance and Sexual Activity  Alcohol Use Not Currently   Alcohol/week: 1.0 standard drink of alcohol   Types: 1 Glasses of wine per week    Social History   Substance and Sexual Activity  Drug Use Yes   Types: Marijuana   Comment: + on urine screen    Tobacco Use: Low Risk  (07/18/2023)   Patient History    Smoking Tobacco Use: Never    Smokeless Tobacco Use: Never    Passive Exposure: Never    Caffeine Use:  caffeine containing beverages/day  []  [x]  Triplicate 12-lead ECG     Visit: M1, M3, M5, M9, M29 Perform in a semi-supine position after 5 minutes of rest.  3 individual ECG tracing should be obtained as closely as possible in succession, but no more than 2 minutes apart. The full set of triplicates should be completed in less than 6 minutes. If ECG abnormal clinically significant  then report as an  AE/SAE.  Rest Start Time:  ECG #1 start time:     ECG #2:     ECG #3:    []  [x]  Vital Signs:  [] Weight, BMI (after 5 mins in a semi-supine position)    Only on Visits: M1, M3, M5, M9, M13, M17, M21, M25, M29, M33, ED, 4W FU, LTFU Visits  [] Height    Visit: M9  [] BP and Pulse (measured in a semi supine position after 5 minutes rest)     Visit: M1, M3, M5, M9, M13, M17, M21, M25, M29, M33, ED, 4WFU  [] Temperature and RR:    Visit: M13, ED  Rest Start Time:   Vital Signs Start Time:     There is no height or weight on file to calculate BMI.  There were no vitals filed for this visit.   []  [x]  Brief Symptom Directed Physical Examination: Will include at a minimum, assessments of skin, lungs, CV, abdomen (liver and spleen). Any abnormalities must be noted in the CRF (e.g current medical conditions or AE logs).     Visit: M1, M3, M5, M6, M7, M8, M9, M11, M13, M17, M21, M25, M29, M33, ED, 4W FU, LTFU   Physical Exam    Yes No Blood Draw Tracking:  All Visits  [x]  []  Verified previous Hgb to be >= 10 (if value <10, adjust volume per unit SOP)                          [] NA, healthy volunteer, previous Hgb results are unavailable  [x]  []  Does participant report non-study blood collection in the previous  2 months?  [x] Yes, estimated vol. 12mL    []  No  [x]  []  Assess Fasting Status.   Visits: M13, M33                                                      Is participant Fasting for at least 6 hours, overnight fast preferred?  [x]  No   []  Yes, Date/Time of last meal?   [x]  []  Blood Drawn.     Problems?  []  No   [x] Yes, why? Had to stick three times, patient appears dehydrated, gave water to drink and encouraged increase in water intake while not feeling well  Time: 0913    Total Vol: 2 mL   Yes No Lab Collection:  []  [x]  RAPID HIV TESTING        Visit: M1, M3, M5, M9, M13, M17, M21, M25, M29, M33, ED, 4WFU  [] REACTIVE or [] NON REACTIVE  If positive/reactive participant must be withdrawn from  the study even if subsequent confirmatory testing is negative. If reactive participant referred for further testing and clinical management as per local standard of care  Is participant experiencing any signs or symptoms consistent with acute HIV infection  [] Yes [x] No   []  [x]  Chemistry     Visits: M1, M3, M4, M5, M6, M7,M8, M9, M11, M13, M17, M21, M25, M29, M33, ED, 4WFU, LTFU  []  [x]  Hematology     Visits: M1, M3, M4, M5, M6, M7, M8, M9, M11, M13, M17, M21, M25, M29, M33, ED, 4WFU, LTFU  []  [x]  Non rapid HIV immunoassay    Visits: M1, M3, M5, M9, M13, M17, M21, M25, M29, M33, ED, 4WFU LTFU  []  [x]  HIV-1 RNA     Visits: M1, M3, M5, M9, M13, M17, M21, M25, M29, M33, ED, 4WFU, LTFU  []  [x]  Neisseria gonorrhea (GC), Chlamydia trachomatis (CT), Trichomononas vaginalis (TV), syphilis.  If positive we will refer for treatment as per local guidelines.   Visit: M1, M3, M5, M9, M13, M17, M21, M25, M29, M33, ED, 4WFU, LTFU  PRN if symptomatic at other visits  Is participant experiencing any symptomatic STIs currently? [] Yes [x] No  [] GC/CT NAAT urine/vaginal swab  *Rectal and oropharyngeal swab can be collected based on reported sexual history. [] GC and CT: Rectal Swab [] Oropharyngeal Swab [] TV: vaginal Swab  *If female and menstruating may take sample at next visit  []  [x]  Coagulation Tests     Visit: M13, M33  []  [x]  Hepatitis B&C     Visit: M13, M25, ED  []  [x]  Fasting Labs: Glucose     Visit: M13 and M33  (Insulin, HbA1c only collect if withdrawal visit occurs at M13 or M33)  []  [x]  Plasma for Storage     Visits: M1, M2, M3,M4, M5, M6, M7,M8, M9, M11, M13, M17, M21, M25, M29, ED, LTFU Visits  []  [x]  Predose: PK Sampling  Collect < 1 hr before injection Visits: M1, M3, M5, M9, M13, M17, M21, M25, M29  []  [x]  Illicit Drug Screen     Visits: M5, M13, M21, M29, M33  []  [x]  Urinalysis: Morning specimen is preferred     Visits: M13, M33, ED, 4WFU  Time of Collection:   LMP: Patient's last  menstrual period was 07/09/2023 (exact date).  []  N/A   Yes No/NA Lab Collection, Cont.:  []  [x]  Urine Pregnancy Test (  [facility] only)  Visits: M1, M3, M5, M9, M13, M17, M21, M25, M29, M33 (resulted prior to receipt of IP), ED, 4WFU, LTFU   Time of Collection:     [] NA, assigned Female at birth or Not a POC[facility]  []                          [x]  Serum Pregnancy Test ([facility] only) only if urine test positive, perform a serum test to confirm.   Yes No/NA Study Drug Administration  []  [x]  CAB LA Administration:     Visit: M1        Needle Size: [] 1.5"  [] 2" (For BMI >/= 30)   There is no height or weight on file to calculate BMI. Gauge: [] 21G [] 22G [] 23G  Site: [] Left Ventrogluteal  [] Right Ventrogluteal  Time of Administration:    []  [x]  CAB ULA Administration:   Visit: M3, M5, M9, M13, M17, M21, M25, M29  Sites should be rotated between right and left Needle Size: [] 1.5"  [] 2" (For BMI >/= 30)   There is no height or weight on file to calculate BMI.  Gauge: [] 21G [] 22G [] 23G  Site: [] Left Ventrogluteal  [] Right Ventrogluteal  Time of Administration:    [x]  []  ISR Review completed, ISR photography necessary [] Yes[x] No     All Visits *Digital photographs will be documented at all visits (scheduled or unscheduled) on all participants who have an injection site reaction that is a visible, persistent Grade 2 (no improvement/resolution in >10 days), Grade >/=3, or serious. *Examination will include a physician/HCP assessment of pain (or tenderness), pruritus, warmth, infections, rash, erythema (or redness), swelling (or induration), and nodules (granulomas or cysts)  *Remote Visits: M6+2W, M7+2W, M8+2W, M10, M12, M15, M19, M23, M27, M31 should assess whether participants have any new ISR symptoms/signs since their last visit or have previously reported ISR that has not improved or is worsening, if present site should perform an unscheduled visits ASAP for ISR assessment   Yes No/NA  Pharmacokinetic  Sampling  [x]  []   Post Dose PK sampling: 2 hours after injection on dosing days(+/- 30 mins collection window Visits:1 week, M1, M1+1Wk, M2, M3, M3+1Wk, M4, M5, M5+1W, M6, M7, M8, M9, , M9+1Wk, M11, M13, M33, ED, 4WFU, LTFU   Time of PK Collection: 0913    Yes No Completed:  [x]  []   HIV/STI  Counseling/Condom Distribution:    All Visits, except remote calls [] Accepted  [x] Declined   Yes No/NA Completed  [x]  []  Was there an ISR during remote phone call visit:  [] Yes [x]  No If yes, schedule unscheduled ISR assessment ASAP Date:   [x]  []  Instructed to contact site if ISR gets worse or does not improve after 10 days from the last ISR assessment.  [x]  []  Assess for Protocol Deviations  [x]  []  Reminders:  Participants of childbearing potential agree to use a highly effective method of contraception consistently and correctly HIV vaccines are not permitted at any time during the study No other experimental agents, antiretroviral drugs, cytotoxic chemotherapy, or radiation therapy my not be administered throughout the trial No systemically administered immunomodulator's permitted Notify study staff if you experience any signs/symptoms, visit provider in ER or scheduled evaluation, or start any new OTC, supplemental or prescription therapies   Yes No Completed:  [x]  []  Compensation:  You will receive a payment of $78.00 at the end of each non dosing visit completed to reimburse you for your time and effort.  You will receive $  125.00 at the end of each dosing visit completed to reimburse you for your time and effort.   If you must return to the clinic for an unscheduled visit, you will receive $78.   Amount: $78.00   [x]  []  Schedule Next Visit: Visits should be planned using a calendar months and scheduled based on the date of the Day 1 injection.    Remote Call Visits schedule on M6+2 wk, M7+2wk, M8+2wk, M10, M12, M15, M19, M23, M27, M31 (Assess ISR, AES, Conmeds)  If transitioning to CAB  200mg /ml do not need LTFU visits, but do need ED visit and 4 week followup visit.  Date: 28APR2025     Time: 0830   Version 2.0 06Mar2025 JLS

## 2023-07-24 ENCOUNTER — Encounter

## 2023-07-25 ENCOUNTER — Ambulatory Visit

## 2023-07-28 ENCOUNTER — Ambulatory Visit

## 2023-08-14 ENCOUNTER — Ambulatory Visit: Admitting: Nurse Practitioner

## 2023-08-17 ENCOUNTER — Encounter: Admitting: *Deleted

## 2023-08-17 ENCOUNTER — Other Ambulatory Visit: Payer: Self-pay

## 2023-08-17 DIAGNOSIS — Z006 Encounter for examination for normal comparison and control in clinical research program: Secondary | ICD-10-CM

## 2023-08-17 NOTE — Research (Unsigned)
 Alexis Hardin [id number]                                                  Protocol Amendment 02  [facility]:  [phone]                                                                  TREATMENT PHASE  Visit: []  Day1+1W     []  M1    []  M1+1W    [x]  M2    []  M3    []  M3+1W    []  M4    [] M5     [] M5+1W   [] M6   [] M6+2W   [] M7   [] M7+2W   [] M8   [] M8+2W   [] M9 [] M9+1W   [] M10   [] M11   [] M12   [] M13   [] M15    [] M17   [] M19   [] M21   [] M23   [] M25   [] M27   [] M29   [] M31   [] M33   [] ED  [] 4 wk followup (if not entering LTFU)   [] LTFU M1   [] LTFU M4   [] LTFU M8   [] LTFU M12     Date: 28Apr2025         4 Letter Code: MELO Subject ID: [id number]  Assessments should occur in the following order:  eCSSRs  All other questionnaires  Triplicate 12-lead ECG  Vital signs  Blood draws (clinical laboratory tests, pre-dose pharmacokinetics, etc)  IP injection  Post-dose pharmacokinetics (if required)  Yes No Completed:  [x]  []  Verify participant identity before doing any study procedures   All Visits  [x]  []  Verify correct version of ICF is signed   All Visits   Yes No Questionnaires Completed: Patient Reported Outcome Assessment must be completed before other assessments take place at each designated visit. Completed electronically by participants.  []  [x]  eCSSRs    Visits: M1, M3, M5, M9, M13, M17, M21, M25, M29, M33, ED Any positive  (abnormal) response indicating SIB or any unusual changes in behavior, confirmed by the investigator will result in their discontinuation of study intervention, the PI/SI will arrange for urgent specialist psychiatric evaluation and management  []  [x]  Social Determinants of Health Questionnaire (SDoH)     Visits: M17, ED  []  [x]  Study Medication Satisfaction Questionnaire (SMSQs)     Visits: M1, M3, M5, M9, M13, M17, M21, M25, M29, M33, ED  []  [x]  PrEP Preference and Rationale Questionnaire (only if prior exposure to oral PrEP)     Visits: M9, M17, M25, M33, ED  []  [x]   Acceptability of Injection Questionnaire     Visits: M1, M3, M5, M9, M13, M17, M21, M25, M29, M33, ED  []  [x]  Generalized Anxiety Disorders (GAD-&) Questionnaire     Visits: M1, M3, M5, M9, M13, M17, M21, M25, M29, M33, ED  []  [x]  HIV-risk and PrEP use Anxiety and Stigma Questionnaire prior to the study drug administration     Visits: M17, M29, M33, ED  []  [x]  Sexual Health Practices Questionnaire-administer prior to study drug administration Visits: M1, M3, M5, M9, M13, M17, M21, M25, M29, M33, ED  Yes No Completed:  [x]  []  Review Changes in Medical History    All Visits  [x]  []  Review Changes in Concomitant Medications    All Visits  [x]  []  Assess for AEs, SAEs: All AEs (from Day 1 onwards) and SAEs (from Screening onwards) must be recorded in eCRF at all visits and reported within required timelines.    All Visits  [x]  []  Pull Review in: Medication Review Medication Indication Dose/Route/Frequency Start Date End Date  Magnesium Glycinate Prophylaxis 50mg  PO QD 01Dec2024 31MAR2025  Nicotinamide Riboside Chloride Powder Prophylaxis 1 Capsule QD 01Dec2024 31MAR2025  Resveratrol Prophylaxis 1 Capsule QD 01Dec2024 31MAR2025  Cephalexin Urinary Tract Infection 500 mg tab QD 06Mar2025 11Mar2025  Amoxicillin   Urinary Tract Infection 875 mg tab BID 12Mar2025 22Mar2025  phenazopyridine  (PYRIDIUM )  Urinary Tract Infection 200 MG tablet TID 12Mar2025 17Mar2025  phenazopyridine  (PYRIDIUM )  Urinary Tract Infection 200 MG tablet TID 28mar2025 12APR2025  Ciprofloxacin  (Cipro ) Urinary tract infection 500 mg PO BID 28MAR2025 02APR2025  nitrofurantoin , macrocrystal-monohydrate, (MACROBID )  Urinary Tract Infection 100 mg PO BID  02APR2025 12APR2025  famotidine  (PEPCID )  nausea 20 mg PO BID 29MAR2025   iohexol  (OMNIPAQUE ) 9 MG/ML Oral contrast Abdominal pain  oral solution 500 mL once 29MAR2025 29MAR2025  alum & mag hydroxide-simeth (MAALOX/MYLANTA)  200-200-20 MG/5ML  nausea Oral suspension 30 mL once  29MAR2025 29MAR2025  lidocaine  (XYLOCAINE ) 2 %  nausea 15 mL PO once 29MAR2025 29MAR2025  lactated ringers  bolus 1,000 mL  lightheadedness 1,000 mL Intravenous once 29MAR2025 29MAR2025  ondansetron  (ZOFRAN )   nausea 4 mg IV once  29MAR2025 29MAR2025  ketorolac  (TORADOL )  Abdominal pain 15 mg Intravenous once 29MAR2025 29MAR2025  iohexol  (OMNIPAQUE ) 300 MG/ML Intravenous Contrast  Abdominal pain  80 mL IV once 29MAR2025 29MAR2025   AE Review Diagnosis/Symptoms Indicate: NSAE SAE AESI Maximum Grade Related/Not Related to study intervention CAB LA or CAB ULA Action/Dose Change Start Date (Time, if available) End Date (Time, if available)  Right Ventrogluteal Injection Site Tenderness NSAE AESI 1 Related to CAB LA No Change, No action taken 28Feb2025 02Mar2025  Urinary Tract Infection NSAE 2 Not Related to study intervention No change, no action taken 04Mar2025 22Mar2025  Urinary tract infection NSAE 2 Not related to study intervention No change, no action taken 28MAR2025 12APR2025  Abdominal pain NSAE 2 Not related to study intervention No change, no action taken 29MAR2025 05APR2025  Nausea NSAE 2 Not related to study intervention No change, no action taken 29MAR2025 05APR2025  Vomiting NSAE 2 Not related to study intervention No change, no action taken 29MAR2025 05APR2025  Lightheadedness NSAE 2 Not related to study intervention No change, no action taken 29MAR2025 30MAR2025  Hiatal hernia NSAE 2 Not related to study intervention No change, no action taken 29MAR2025   Uterine leiomyoma, unspecified location  NSAE 2 Not related to study intervention No change, no action taken 29MAR2025    Medical History Review  Diagnosis Grade Start Date End Date  Bradycardia, asymptomatic 1 Unk 2025   Irritable Bowel Syndrome 2 Unk 2023   Intermittent Low Back Pain 2 05Aug2022   Conjunctivitis of Left Eye 2 08Jul2023 17Jul2023  Urinary Tract Infection 2 12Jan2024 17Jan2024  Anemia 2 17Jun2024        [x]  []  Contraception Review: Report in [facility] Visits: M1, M2, M3, M4, M5, M6, M7, M8, M9, M11, M13, M17, M21,M25, M29, M31, M33, ED, 4WFU, LTFU Visits Female of childbearing potential: Must agree to use a highly effective method of contraception consistently and correctly, must also continue  to use adequate contraception methods for at least 52 weeks after the last injection. Female condoms must be used in addition to hormonal contraception Sexual Abstinence: considered a highly effective method only if defined as refraining from heterosexual intercourse during the entire period of risk associated with the study intervention.  The reliability of sexual abstinence needs to be evaluated in relation to the duration of the study and the preferred and usual lifestyle of the participant.   Yes No/NA Completed:  []  [x]  Premature cardiovascular disease: female participant <65 years or female participant <55 years in first degree relatives only.  Note changes since the start of the study.     Months 5, 13, 21, 29      [] No Changes    []  [x]  Substance Usage: (list name of substance, amount used, start and stop dates)  Months 5, 50, 53, 16 Social History   Substance and Sexual Activity  Alcohol Use Not Currently   Alcohol/week: 1.0 standard drink of alcohol   Types: 1 Glasses of wine per week    Social History   Substance and Sexual Activity  Drug Use Yes   Types: Marijuana   Comment: + on urine screen    Tobacco Use: Low Risk  (07/18/2023)   Patient History    Smoking Tobacco Use: Never    Smokeless Tobacco Use: Never    Passive Exposure: Never    Caffeine Use:  caffeine containing beverages/day  []  [x]  Triplicate 12-lead ECG     Visit: M1, M3, M5, M9, M29 Perform in a semi-supine position after 5 minutes of rest.  3 individual ECG tracing should be obtained as closely as possible in succession, but no more than 2 minutes apart. The full set of triplicates should be completed in less than 6  minutes. If ECG abnormal clinically significant  then report as an AE/SAE.  Rest Start Time:  ECG #1 start time:     ECG #2:    ECG #3:    []  [x]  Vital Signs:  [] Weight, BMI (after 5 mins in a semi-supine position)    Only on Visits: M1, M3, M5, M9, M13, M17, M21, M25, M29, M33, ED, 4W FU, LTFU Visits  [] Height    Visit: M9  [] BP and Pulse (measured in a semi supine position after 5 minutes rest)     Visit: M1, M3, M5, M9, M13, M17, M21, M25, M29, M33, ED, 4WFU  [] Temperature and RR:    Visit: M13, ED  Rest Start Time:   Vital Signs Start Time:    There is no height or weight on file to calculate BMI.  There were no vitals filed for this visit.  []  [x]  Brief Symptom Directed Physical Examination: Will include at a minimum, assessments of skin, lungs, CV, abdomen (liver and spleen). Any abnormalities must be noted in the CRF (e.g current medical conditions or AE logs).     Visit: M1, M3, M5, M6, M7, M8, M9, M11, M13, M17, M21, M25, M29, M33, ED, 4W FU, LTFU   Physical Exam    Yes No Blood Draw Tracking:  All Visits  [x]  []  Verified previous Hgb to be >= 10 (if value <10, adjust volume per unit SOP)                          [] NA, healthy volunteer, previous Hgb results are unavailable  [x]  []  Does participant report non-study blood collection in the previous 2 months?  []   Yes, estimated vol. mL    [x]  No  [x]  []  Assess Fasting Status.   Visits: M13, M33                                                      Is participant Fasting for at least 6 hours, overnight fast preferred?  [x]  No   []  Yes, Date/Time of last meal?   [x]  []  Blood Drawn.     Problems?  [x]  No   [] Yes, why?   Time: 0910    Total Vol: 4 mL   Yes No Lab Collection:  []  [x]  RAPID HIV TESTING        Visit: M1, M3, M5, M9, M13, M17, M21, M25, M29, M33, ED, 4WFU  [] REACTIVE or [] NON REACTIVE  If positive/reactive participant must be withdrawn from the study even if subsequent confirmatory testing is negative. If  reactive participant referred for further testing and clinical management as per local standard of care  Is participant experiencing any signs or symptoms consistent with acute HIV infection  [] Yes [] No   []  [x]  Chemistry     Visits: M1, M3, M4, M5, M6, M7,M8, M9, M11, M13, M17, M21, M25, M29, M33, ED, 4WFU, LTFU  []  [x]  Hematology     Visits: M1, M3, M4, M5, M6, M7, M8, M9, M11, M13, M17, M21, M25, M29, M33, ED, 4WFU, LTFU  []  [x]  Non rapid HIV immunoassay    Visits: M1, M3, M5, M9, M13, M17, M21, M25, M29, M33, ED, 4WFU LTFU  []  [x]  HIV-1 RNA     Visits: M1, M3, M5, M9, M13, M17, M21, M25, M29, M33, ED, 4WFU, LTFU  []  [x]  Neisseria gonorrhea (GC), Chlamydia trachomatis (CT), Trichomononas vaginalis (TV), syphilis.  If positive we will refer for treatment as per local guidelines.   Visit: M1, M3, M5, M9, M13, M17, M21, M25, M29, M33, ED, 4WFU, LTFU  PRN if symptomatic at other visits  Is participant experiencing any symptomatic STIs currently? [] Yes [] No  [] GC/CT NAAT urine/vaginal swab  *Rectal and oropharyngeal swab can be collected based on reported sexual history. [] GC and CT: Rectal Swab [] Oropharyngeal Swab [] TV: vaginal Swab  *If female and menstruating may take sample at next visit  []  [x]  Coagulation Tests     Visit: M13, M33  []  [x]  Hepatitis B&C     Visit: M13, M25, ED  []  [x]  Fasting Labs: Glucose     Visit: M13 and M33  (Insulin, HbA1c only collect if withdrawal visit occurs at M13 or M33)  [x]  []  Plasma for Storage     Visits: M1, M2, M3,M4, M5, M6, M7,M8, M9, M11, M13, M17, M21, M25, M29, ED, LTFU Visits  []  [x]  Predose: PK Sampling  Collect < 1 hr before injection Visits: M1, M3, M5, M9, M13, M17, M21, M25, M29  []  [x]  Illicit Drug Screen     Visits: M5, M13, M21, M29, M33  []  [x]  Urinalysis: Morning specimen is preferred     Visits: M13, M33, ED, 4WFU  Time of Collection:   LMP: No LMP recorded.  []  N/A   Yes No/NA Lab Collection, Cont.:  []  [x]  Urine Pregnancy  Test (POCBP only)  Visits: M1, M3, M5, M9, M13, M17, M21, M25, M29, M33 (resulted prior to receipt of IP), ED, 4WFU, LTFU   Time of Collection:     []   NA, assigned Female at birth or Not a POCBP  []                          [x]  Serum Pregnancy Test (POCBP only) only if urine test positive, perform a serum test to confirm.   Yes No/NA Study Drug Administration  []  [x]  CAB LA Administration:     Visit: M1        Needle Size: [] 1.5"  [] 2" (For BMI >/= 30)   There is no height or weight on file to calculate BMI. Gauge: [] 21G [] 22G [] 23G  Site: [] Left Ventrogluteal  [] Right Ventrogluteal  Time of Administration:    []  [x]  CAB ULA Administration:   Visit: M3, M5, M9, M13, M17, M21, M25, M29  Sites should be rotated between right and left Needle Size: [] 1.5"  [] 2" (For BMI >/= 30)   There is no height or weight on file to calculate BMI.  Gauge: [] 21G [] 22G [] 23G  Site: [] Left Ventrogluteal  [] Right Ventrogluteal  Time of Administration:    [x]  []  ISR Review completed, ISR photography necessary [] Yes[x] No     All Visits *Digital photographs will be documented at all visits (scheduled or unscheduled) on all participants who have an injection site reaction that is a visible, persistent Grade 2 (no improvement/resolution in >10 days), Grade >/=3, or serious. *Examination will include a physician/HCP assessment of pain (or tenderness), pruritus, warmth, infections, rash, erythema (or redness), swelling (or induration), and nodules (granulomas or cysts)  *Remote Visits: M6+2W, M7+2W, M8+2W, M10, M12, M15, M19, M23, M27, M31 should assess whether participants have any new ISR symptoms/signs since their last visit or have previously reported ISR that has not improved or is worsening, if present site should perform an unscheduled visits ASAP for ISR assessment   Yes No/NA  Pharmacokinetic Sampling  [x]  []   Post Dose PK sampling: 2 hours after injection on dosing days(+/- 30 mins collection  window Visits:1 week, M1, M1+1Wk, M2, M3, M3+1Wk, M4, M5, M5+1W, M6, M7, M8, M9, , M9+1Wk, M11, M13, M33, ED, 4WFU, LTFU   Time of Injection: Last given 24Mar2025 at 1247 (Month 1 visit)  Time of PK Collection: 0910    Yes No Completed:  [x]  []   HIV/STI  Counseling/Condom Distribution:    All Visits, except remote calls [] Accepted  [x] Declined   Yes No/NA Completed  []  [x]  Was there an ISR during remote phone call visit:  [] Yes []  No If yes, schedule unscheduled ISR assessment ASAP Date: ***  [x]  []  Instructed to contact site if ISR gets worse or does not improve after 10 days from the last ISR assessment.  [x]  []  Assess for Protocol Deviations  [x]  []  Reminders:  Participants of childbearing potential agree to use a highly effective method of contraception consistently and correctly HIV vaccines are not permitted at any time during the study No other experimental agents, antiretroviral drugs, cytotoxic chemotherapy, or radiation therapy my not be administered throughout the trial No systemically administered immunomodulator's permitted Notify study staff if you experience any signs/symptoms, visit provider in ER or scheduled evaluation, or start any new OTC, supplemental or prescription therapies   Yes No Completed:  [x]  []  Compensation:  You will receive a payment of $78.00 at the end of each non dosing visit completed to reimburse you for your time and effort.  You will receive $125.00 at the end of each dosing visit completed to reimburse you for your time and effort.   If  you must return to the clinic for an unscheduled visit, you will receive $78.   Amount: $78   [x]  []  Schedule Next Visit: Visits should be planned using a calendar months and scheduled based on the date of the Day 1 injection.    Remote Call Visits schedule on M6+2 wk, M7+2wk, M8+2wk, M10, M12, M15, M19, M23, M27, M31 (Assess ISR, AES, Conmeds)  If transitioning to CAB 200mg /ml do not need LTFU visits, but do  need ED visit and 4 week followup visit.  Date: 02Jun2025     Time: 0830   Version 2.0 06Mar2025 JLS

## 2023-08-19 NOTE — Progress Notes (Signed)
 I have reviewed the chart and associated labs.

## 2023-08-20 ENCOUNTER — Telehealth: Payer: Self-pay

## 2023-08-20 NOTE — Telephone Encounter (Signed)
 Text visit reminder sent

## 2023-08-20 NOTE — Progress Notes (Signed)
 QC complete by KP 20 Aug 2023

## 2023-08-26 NOTE — Progress Notes (Signed)
 QC completed by Garon Kaiser

## 2023-08-27 ENCOUNTER — Telehealth: Payer: Self-pay | Admitting: Nurse Practitioner

## 2023-08-27 NOTE — Telephone Encounter (Signed)
 Lvm to return my call regarding GI referral.

## 2023-09-18 ENCOUNTER — Other Ambulatory Visit: Payer: Self-pay

## 2023-09-18 ENCOUNTER — Encounter

## 2023-09-18 VITALS — BP 107/73 | HR 59 | Wt 136.7 lb

## 2023-09-18 DIAGNOSIS — Z006 Encounter for examination for normal comparison and control in clinical research program: Secondary | ICD-10-CM

## 2023-09-18 MED ORDER — STUDY - EXTEND - CABOTEGRAVIR ULA (GSK1265744) 1600 MG IM INJECTION (PI-VAN DAM)
1600.0000 mg | INJECTION | Freq: Once | INTRAMUSCULAR | Status: AC
  Administered 2023-09-18: 1600 mg via INTRAMUSCULAR
  Filled 2023-09-18: qty 3

## 2023-09-18 NOTE — Research (Addendum)
 Alexis Hardin [id number]                                                  Protocol Amendment 02  [facility] Research Site:  [phone]                                                                  TREATMENT PHASE  Visit: []  Day1+1W     []  M1    []  M1+1W    []  M2    [x]  M3    []  M3+1W    []  M4    [] M5     [] M5+1W   [] M6   [] M6+2W   [] M7   [] M7+2W   [] M8   [] M8+2W   [] M9 [] M9+1W   [] M10   [] M11   [] M12   [] M13   [] M15    [] M17   [] M19   [] M21   [] M23   [] M25   [] M27   [] M29   [] M31   [] M33   [] ED  [] 4 wk followup (if not entering LTFU)   [] LTFU M1   [] LTFU M4   [] LTFU M8   [] LTFU M12     Date: 30MAY2025         4 Letter Code: MELO Subject ID: [id number]  Assessments should occur in the following order:  eCSSRs  All other questionnaires  Triplicate 12-lead ECG  Vital signs  Blood draws (clinical laboratory tests, pre-dose pharmacokinetics, etc)  IP injection  Post-dose pharmacokinetics (if required)  Yes No Completed:  [x]  []  Verify participant identity before doing any study procedures   All Visits  [x]  []  Verify correct version of ICF is signed   All Visits   Yes No Questionnaires Completed: Patient Reported Outcome Assessment must be completed before other assessments take place at each designated visit. Completed electronically by participants.  [x]  []  eCSSRs    Visits: M1, M3, M5, M9, M13, M17, M21, M25, M29, M33, ED Any positive  (abnormal) response indicating SIB or any unusual changes in behavior, confirmed by the investigator will result in their discontinuation of study intervention, the PI/SI will arrange for urgent specialist psychiatric evaluation and management  []  [x]  Social Determinants of Health Questionnaire (SDoH)     Visits: M17, ED  [x]  []  Study Medication Satisfaction Questionnaire (SMSQs)     Visits: M1, M3, M5, M9, M13, M17, M21, M25, M29, M33, ED  []  [x]  PrEP Preference and Rationale Questionnaire (only if prior exposure to oral PrEP)     Visits: M9, M17, M25, M33, ED  [x]  []   Acceptability of Injection Questionnaire     Visits: M1, M3, M5, M9, M13, M17, M21, M25, M29, M33, ED  [x]  []  Generalized Anxiety Disorders (GAD-&) Questionnaire     Visits: M1, M3, M5, M9, M13, M17, M21, M25, M29, M33, ED  []  [x]  HIV-risk and PrEP use Anxiety and Stigma Questionnaire prior to the study drug administration     Visits: M17, M29, M33, ED  [x]  []  Sexual Health Practices Questionnaire-administer prior to study drug administration Visits: M1, M3, M5, M9, M13, M17, M21, M25, M29, M33, ED  Yes No Completed:  [x]  []  Review Changes in Medical History    All Visits  [x]  []  Review Changes in Concomitant Medications    All Visits  [x]  []  Assess for AEs, SAEs: All AEs (from Day 1 onwards) and SAEs (from Screening onwards) must be recorded in eCRF at all visits and reported within required timelines.    All Visits  Is participant experiencing any symptomatic STIs currently? [] Yes [x] No    [x]  []  Pull Review in: Medication Review Medication Indication Dose/Route/Frequency Start Date End Date  Magnesium Glycinate Prophylaxis 50mg  PO QD 01Dec2024 31MAR2025  Nicotinamide Riboside Chloride Powder Prophylaxis 1 Capsule QD 01Dec2024 31MAR2025  Resveratrol Prophylaxis 1 Capsule QD 01Dec2024 31MAR2025  Cephalexin Urinary Tract Infection 500 mg tab QD 06Mar2025 11Mar2025  Amoxicillin   Urinary Tract Infection 875 mg tab BID 12Mar2025 22Mar2025  phenazopyridine  (PYRIDIUM )  Urinary Tract Infection 200 MG tablet TID 12Mar2025 17Mar2025  phenazopyridine  (PYRIDIUM )  Urinary Tract Infection 200 MG tablet TID 28mar2025 09APR2025  Ciprofloxacin  (Cipro ) Urinary tract infection 500 mg PO BID 28MAR2025 02APR2025  nitrofurantoin , macrocrystal-monohydrate, (MACROBID )  Urinary Tract Infection 100 mg PO BID  02APR2025 09APR2025  famotidine  (PEPCID )  nausea 20 mg PO BID 29MAR2025   iohexol  (OMNIPAQUE ) 9 MG/ML Oral contrast Abdominal pain  oral solution 500 mL once 29MAR2025 29MAR2025  alum & mag  hydroxide-simeth (MAALOX/MYLANTA)  200-200-20 MG/5ML  nausea Oral suspension 30 mL once 29MAR2025 29MAR2025  lidocaine  (XYLOCAINE ) 2 %  nausea 15 mL PO once 29MAR2025 29MAR2025  lactated ringers  bolus 1,000 mL  lightheadedness 1,000 mL Intravenous once 29MAR2025 29MAR2025  ondansetron  (ZOFRAN )   nausea 4 mg IV once  29MAR2025 29MAR2025  ketorolac  (TORADOL )  Abdominal pain 15 mg Intravenous once 29MAR2025 29MAR2025  iohexol  (OMNIPAQUE ) 300 MG/ML Intravenous Contrast  Abdominal pain  80 mL IV once 29MAR2025 29MAR2025   AE Review Diagnosis/Symptoms Indicate: NSAE SAE AESI Maximum Grade Related/Not Related to study intervention CAB LA or CAB ULA Action/Dose Change Start Date (Time, if available) End Date (Time, if available)  Right Ventrogluteal Injection Site Tenderness NSAE  1 Related to CAB LA No Change, No action taken 28Feb2025 02Mar2025  Urinary Tract Infection NSAE 2 Not Related to study intervention No change, no action taken 04Mar2025 22Mar2025  Urinary tract infection NSAE 2 Not related to study intervention No change, no action taken 28MAR2025 09APR2025  Abdominal pain NSAE 2 Not related to study intervention No change, no action taken 29MAR2025 09APR2025  Nausea NSAE 2 Not related to study intervention No change, no action taken 29MAR2025 09APR2025  Vomiting NSAE 2 Not related to study intervention No change, no action taken 29MAR2025 09APR2025  Lightheadedness NSAE 2 Not related to study intervention No change, no action taken 29MAR2025 30MAR2025  Hiatal hernia NSAE 2 Not related to study intervention No change, no action taken 29MAR2025   Uterine leiomyoma, unspecified location  NSAE 2 Not related to study intervention No change, no action taken 29MAR2025    Medical History Review  Diagnosis Grade Start Date End Date  Bradycardia, asymptomatic 1 Unk 2025   Irritable Bowel Syndrome 2 Unk 2023   Intermittent Low Back Pain 2 05Aug2022   Conjunctivitis of Left Eye 2 08Jul2023  17Jul2023  Urinary Tract Infection 2 12Jan2024 17Jan2024  Anemia 2 17Jun2024       [x]  []  Contraception Review: Report in [facility] Visits: M1, M2, M3, M4, M5, M6, M7, M8, M9, M11, M13, M17, M21,M25, M29, M31, M33, ED, 4WFU, LTFU Visits Female of childbearing potential: Must agree to use  a highly effective method of contraception consistently and correctly, must also continue to use adequate contraception methods for at least 52 weeks after the last injection. Female condoms must be used in addition to hormonal contraception Sexual Abstinence: considered a highly effective method only if defined as refraining from heterosexual intercourse during the entire period of risk associated with the study intervention.  The reliability of sexual abstinence needs to be evaluated in relation to the duration of the study and the preferred and usual lifestyle of the participant.   Yes No/NA Completed:  []  [x]  Premature cardiovascular disease: female participant <65 years or female participant <55 years in first degree relatives only.  Note changes since the start of the study.     Months 5, 13, 21, 29      [] No Changes    []  [x]  Substance Usage: (list name of substance, amount used, start and stop dates)  Months 5, 19, 88, 83 Social History   Substance and Sexual Activity  Alcohol Use Not Currently   Alcohol/week: 1.0 standard drink of alcohol   Types: 1 Glasses of wine per week    Social History   Substance and Sexual Activity  Drug Use Yes   Types: Marijuana   Comment: + on urine screen    Tobacco Use: Low Risk  (09/18/2023)   Patient History    Smoking Tobacco Use: Never    Smokeless Tobacco Use: Never    Passive Exposure: Never    Caffeine Use:  caffeine containing beverages/day  [x]  []  Triplicate 12-lead ECG     Visit: M1, M3, M5, M9, M29 Perform in a semi-supine position after 5 minutes of rest.  3 individual ECG tracing should be obtained as closely as possible in succession, but no more than  2 minutes apart. The full set of triplicates should be completed in less than 6 minutes. If ECG abnormal clinically significant  then report as an AE/SAE.  Rest Start Time: 0923 ECG #1 start time: 0934    ECG #2: 0935    ECG #3: 0937   [x]  []  Vital Signs:  [x] Weight, BMI (after 5 mins in a semi-supine position)    Only on Visits: M1, M3, M5, M9, M13, M17, M21, M25, M29, M33, ED, 4W FU, LTFU Visits  [] Height    Visit: M9  [x] BP and Pulse (measured in a semi supine position after 5 minutes rest)     Visit: M1, M3, M5, M9, M13, M17, M21, M25, M29, M33, ED, 4WFU  [] Temperature and RR:    Visit: M13, ED  Rest Start Time: 1610  Vital Signs Start Time: 0942  Last Weight  Most recent update: 09/18/2023  9:49 AM    Weight  62 kg (136 lb 11 oz)             Body mass index is 26.69 kg/m.  Vitals:   09/18/23 0942  BP: 107/73  Pulse: (!) 59     [x]  []  Brief Symptom Directed Physical Examination: Will include at a minimum, assessments of skin, lungs, CV, abdomen (liver and spleen). Any abnormalities must be noted in the CRF (e.g current medical conditions or AE logs).     Visit: M1, M3, M5, M6, M7, M8, M9, M11, M13, M17, M21, M25, M29, M33, ED, 4W FU, LTFU   Physical Exam Constitutional:      Appearance: Normal appearance. She is normal weight.  Cardiovascular:     Rate and Rhythm: Normal rate and regular rhythm.  Pulmonary:  Effort: Pulmonary effort is normal.     Breath sounds: Normal breath sounds.  Abdominal:     General: Abdomen is flat. Bowel sounds are normal. There is no distension.     Palpations: Abdomen is soft.     Tenderness: There is no abdominal tenderness. There is no guarding.  Musculoskeletal:        General: Normal range of motion.  Skin:    General: Skin is warm and dry.  Neurological:     General: No focal deficit present.     Mental Status: She is alert and oriented to person, place, and time. Mental status is at baseline.  Psychiatric:        Mood and  Affect: Mood normal.        Behavior: Behavior normal.        Thought Content: Thought content normal.        Judgment: Judgment normal.      Yes No Blood Draw Tracking:  All Visits  [x]  []  Verified previous Hgb to be >= 10 (if value <10, adjust volume per unit SOP)                          [] NA, healthy volunteer, previous Hgb results are unavailable  [x]  []  Does participant report non-study blood collection in the previous 2 months?  [] Yes, estimated vol. mL    [x]  No  []  [x]  Assess Fasting Status.   Visits: M13, M33                                                      Is participant Fasting for at least 6 hours, overnight fast preferred?  [x]  No   []  Yes, Date/Time of last meal?   [x]  []  Blood Drawn.     Problems?  [x]  No   [] Yes, why?   Time: 1015    Total Vol: 36 mL   Yes No Lab Collection:  [x]  []  RAPID HIV TESTING        Visit: M1, M3, M5, M9, M13, M17, M21, M25, M29, M33, ED, 4WFU  [] REACTIVE or [x] NON REACTIVE  If positive/reactive participant must be withdrawn from the study even if subsequent confirmatory testing is negative. If reactive participant referred for further testing and clinical management as per local standard of care  Is participant experiencing any signs or symptoms consistent with acute HIV infection  [] Yes [x] No   [x]  []  Chemistry     Visits: M1, M3, M4, M5, M6, M7,M8, M9, M11, M13, M17, M21, M25, M29, M33, ED, 4WFU, LTFU  [x]  []  Hematology     Visits: M1, M3, M4, M5, M6, M7, M8, M9, M11, M13, M17, M21, M25, M29, M33, ED, 4WFU, LTFU  [x]  []  Non rapid HIV immunoassay    Visits: M1, M3, M5, M9, M13, M17, M21, M25, M29, M33, ED, 4WFU LTFU  [x]  []  HIV-1 RNA     Visits: M1, M3, M5, M9, M13, M17, M21, M25, M29, M33, ED, 4WFU, LTFU  [x]  []  Neisseria gonorrhea (GC), Chlamydia trachomatis (CT), Trichomononas vaginalis (TV), syphilis.  If positive we will refer for treatment as per local guidelines.   Visit: M1, M3, M5, M9, M13, M17, M21, M25, M29, M33, ED, 4WFU,  LTFU, PRN   **PRN if symptomatic at other visits**  [  x]GC/CT NAAT urine/vaginal swab  *Rectal and oropharyngeal swab can be collected based on reported sexual history. [x] GC and CT: Rectal Swab [x] Oropharyngeal Swab [x] TV: vaginal Swab  *If female and menstruating may take sample at next visit  []  [x]  Coagulation Tests     Visit: M13, M33  []  [x]  Hepatitis B&C     Visit: M13, M25, ED  []  [x]  Fasting Labs: Glucose     Visit: M13 and M33  (Insulin, HbA1c only collect if withdrawal visit occurs at M13 or M33)  [x]  []  Plasma for Storage     Visits: M1, M2, M3,M4, M5, M6, M7,M8, M9, M11, M13, M17, M21, M25, M29, ED, LTFU Visits  [x]  []  Predose: PK Sampling  Collect < 1 hr before injection Visits: M1, M3, M5, M9, M13, M17, M21, M25, M29  []  [x]  Illicit Drug Screen     Visits: M5, M13, M21, M29, M33  []  [x]  Urinalysis: Morning specimen is preferred     Visits: M13, M33, ED, 4WFU  Time of Collection:   LMP: No LMP recorded.  []  N/A   Yes No/NA Lab Collection, Cont.:  [x]  []  Urine Pregnancy Test (POCBP only)  Visits: M1, M3, M5, M9, M13, M17, M21, M25, M29, M33 (resulted prior to receipt of IP), ED, 4WFU, LTFU   Time of Collection: 0944    [] NA, assigned Female at birth or Not a POCBP  []                          [x]  Serum Pregnancy Test (POCBP only) only if urine test positive, perform a serum test to confirm.   Yes No/NA Study Drug Administration  []  [x]  CAB LA Administration:     Visit: M1        Needle Size: [] 1.5"  [] 2" (For BMI >/= 30)   Body mass index is 26.69 kg/m. Gauge: [] 21G [] 22G [] 23G  Site: [] Left Ventrogluteal  [] Right Ventrogluteal  Time of Administration:    [x]  []  CAB ULA Administration:   Visit: M3, M5, M9, M13, M17, M21, M25, M29  Sites should be rotated between right and left Needle Size: [x] 1.5"  [] 2" (For BMI >/= 30)   Body mass index is 26.69 kg/m.  Gauge: [] 21G [x] 22G [] 23G  Site: [] Left Ventrogluteal  [x] Right Ventrogluteal  Time of Administration: 1039    [x]  []  ISR Review completed, ISR photography necessary [] Yes[x] No     All Visits *Digital photographs will be documented at all visits (scheduled or unscheduled) on all participants who have an injection site reaction that is a visible, persistent Grade 2 (no improvement/resolution in >10 days), Grade >/=3, or serious. *Examination will include a physician/HCP assessment of pain (or tenderness), pruritus, warmth, infections, rash, erythema (or redness), swelling (or induration), and nodules (granulomas or cysts)  *Remote Visits: M6+2W, M7+2W, M8+2W, M10, M12, M15, M19, M23, M27, M31 should assess whether participants have any new ISR symptoms/signs since their last visit or have previously reported ISR that has not improved or is worsening, if present site should perform an unscheduled visits ASAP for ISR assessment   Yes No/NA  Pharmacokinetic Sampling  [x]  []   Post Dose PK sampling: 2 hours after injection on dosing days(+/- 30 mins collection window Visits:1 week, M1, M1+1Wk, M2, M3, M3+1Wk, M4, M5, M5+1W, M6, M7, M8, M9, , M9+1Wk, M11, M13, M33, ED, 4WFU, LTFU   Time of Injection: 1039  Time of PK Collection: 1249    Yes No Completed:  [x]  []   HIV/STI  Counseling/Condom Distribution:    All Visits, except remote calls [] Accepted  [x] Declined   Yes No/NA Completed  []  [x]  Was there an ISR during remote phone call visit:  [] Yes [x]  No If yes, schedule unscheduled ISR assessment ASAP Date:   [x]  []  Instructed to contact site if ISR gets worse or does not improve after 10 days from the last ISR assessment.  [x]  []  Assess for Protocol Deviations  [x]  []  Reminders:  Participants of childbearing potential agree to use a highly effective method of contraception consistently and correctly HIV vaccines are not permitted at any time during the study No other experimental agents, antiretroviral drugs, cytotoxic chemotherapy, or radiation therapy my not be administered throughout the trial No  systemically administered immunomodulator's permitted Notify study staff if you experience any signs/symptoms, visit provider in ER or scheduled evaluation, or start any new OTC, supplemental or prescription therapies   Yes No Completed:  [x]  []  Compensation:  You will receive a payment of $78.00 at the end of each non dosing visit completed to reimburse you for your time and effort.  You will receive $125.00 at the end of each dosing visit completed to reimburse you for your time and effort.   If you must return to the clinic for an unscheduled visit, you will receive $78.   Amount: $125.00   [x]  []  Schedule Next Visit: Visits should be planned using a calendar months and scheduled based on the date of the Day 1 injection.    Remote Call Visits schedule on M6+2 wk, M7+2wk, M8+2wk, M10, M12, M15, M19, M23, M27, M31 (Assess ISR, AES, Conmeds)  If transitioning to CAB 200mg /ml do not need LTFU visits, but do need ED visit and 4 week followup visit.  Date: 06JUN2025     Time: 0830   Version 2.1 01May2025 JLS

## 2023-09-21 ENCOUNTER — Encounter

## 2023-09-22 NOTE — Research (Signed)
 Alexis Hardin

## 2023-09-24 ENCOUNTER — Telehealth: Payer: Self-pay

## 2023-09-24 DIAGNOSIS — Z006 Encounter for examination for normal comparison and control in clinical research program: Secondary | ICD-10-CM

## 2023-09-24 NOTE — Telephone Encounter (Signed)
 Reminder text sent about visit tomorrow at 0830 for Extend 65M month 4.

## 2023-09-25 ENCOUNTER — Encounter

## 2023-09-25 ENCOUNTER — Other Ambulatory Visit: Payer: Self-pay

## 2023-09-25 DIAGNOSIS — Z006 Encounter for examination for normal comparison and control in clinical research program: Secondary | ICD-10-CM

## 2023-09-25 NOTE — Research (Cosign Needed Addendum)
 Alexis Hardin [id number]                                                  Protocol Amendment 02  [facility]:  [phone]                                                                  TREATMENT PHASE  Visit: []  Day1+1W     []  M1    []  M1+1W    []  M2    []  M3    [x]  M3+1W    []  M4    [] M5     [] M5+1W   [] M6   [] M6+2W   [] M7   [] M7+2W   [] M8   [] M8+2W   [] M9 [] M9+1W   [] M10   [] M11   [] M12   [] M13   [] M15    [] M17   [] M19   [] M21   [] M23   [] M25   [] M27   [] M29   [] M31   [] M33   [] ED  [] 4 wk followup (if not entering LTFU)   [] LTFU M1   [] LTFU M4   [] LTFU M8   [] LTFU M12     Date: 06JUN2025         4 Letter Code: MELO Subject ID: [id number]  Assessments should occur in the following order:  eCSSRs  All other questionnaires  Triplicate 12-lead ECG  Vital signs  Blood draws (clinical laboratory tests, pre-dose pharmacokinetics, etc)  IP injection  Post-dose pharmacokinetics (if required)  Yes No Completed:  [x]  []  Verify participant identity before doing any study procedures   All Visits  [x]  []  Verify correct version of ICF is signed   All Visits   Yes No Questionnaires Completed: Patient Reported Outcome Assessment must be completed before other assessments take place at each designated visit. Completed electronically by participants.  []  [x]  eCSSRs    Visits: M1, M3, M5, M9, M13, M17, M21, M25, M29, M33, ED Any positive  (abnormal) response indicating SIB or any unusual changes in behavior, confirmed by the investigator will result in their discontinuation of study intervention, the PI/SI will arrange for urgent specialist psychiatric evaluation and management  []  [x]  Social Determinants of Health Questionnaire (SDoH)     Visits: M17, ED  []  [x]  Study Medication Satisfaction Questionnaire (SMSQs)     Visits: M1, M3, M5, M9, M13, M17, M21, M25, M29, M33, ED  []  [x]  PrEP Preference and Rationale Questionnaire (only if prior exposure to oral PrEP)     Visits: M9, M17, M25, M33, ED  []  [x]   Acceptability of Injection Questionnaire     Visits: M1, M3, M5, M9, M13, M17, M21, M25, M29, M33, ED  []  [x]  Generalized Anxiety Disorders (GAD-&) Questionnaire     Visits: M1, M3, M5, M9, M13, M17, M21, M25, M29, M33, ED  []  [x]  HIV-risk and PrEP use Anxiety and Stigma Questionnaire prior to the study drug administration     Visits: M17, M29, M33, ED  []  [x]  Sexual Health Practices Questionnaire-administer prior to study drug administration Visits: M1, M3, M5, M9, M13, M17, M21, M25, M29, M33, ED  Yes No Completed:  [x]  []  Review Changes in Medical History    All Visits  [x]  []  Review Changes in Concomitant Medications    All Visits  [x]  []  Assess for AEs, SAEs: All AEs (from Day 1 onwards) and SAEs (from Screening onwards) must be recorded in eCRF at all visits and reported within required timelines.    All Visits  Is participant experiencing any symptomatic STIs currently? [] Yes [x] No    [x]  []  Pull Review in: Medication Review Medication Indication Dose/Route/Frequency Start Date End Date  Magnesium Glycinate Prophylaxis 50mg  PO QD 01Dec2024 31MAR2025  Nicotinamide Riboside Chloride Powder Prophylaxis 1 Capsule QD 01Dec2024 31MAR2025  Resveratrol Prophylaxis 1 Capsule QD 01Dec2024 31MAR2025  Cephalexin Urinary Tract Infection 500 mg tab QD 06Mar2025 11Mar2025  Amoxicillin   Urinary Tract Infection 875 mg tab BID 12Mar2025 22Mar2025  phenazopyridine  (PYRIDIUM )  Urinary Tract Infection 200 MG tablet TID 12Mar2025 17Mar2025  phenazopyridine  (PYRIDIUM )  Urinary Tract Infection 200 MG tablet TID 28mar2025 09APR2025  Ciprofloxacin  (Cipro ) Urinary tract infection 500 mg PO BID 28MAR2025 02APR2025  nitrofurantoin , macrocrystal-monohydrate, (MACROBID )  Urinary Tract Infection 100 mg PO BID  02APR2025 09APR2025  famotidine  (PEPCID )  nausea 20 mg PO BID 29MAR2025   iohexol  (OMNIPAQUE ) 9 MG/ML Oral contrast Abdominal pain  oral solution 500 mL once 29MAR2025 29MAR2025  alum & mag  hydroxide-simeth (MAALOX/MYLANTA)  200-200-20 MG/5ML  nausea Oral suspension 30 mL once 29MAR2025 29MAR2025  lidocaine  (XYLOCAINE ) 2 %  nausea 15 mL PO once 29MAR2025 29MAR2025  lactated ringers  bolus 1,000 mL  lightheadedness 1,000 mL Intravenous once 29MAR2025 29MAR2025  ondansetron  (ZOFRAN )   nausea 4 mg IV once  29MAR2025 29MAR2025  ketorolac  (TORADOL )  Abdominal pain 15 mg Intravenous once 29MAR2025 29MAR2025  iohexol  (OMNIPAQUE ) 300 MG/ML Intravenous Contrast  Abdominal pain  80 mL IV once 29MAR2025 29MAR2025  ibuprofen Right ventrogluteal injection site tenderness 200mg  PO Q8 hours 02JUN2025    AE Review Diagnosis/Symptoms Indicate: NSAE SAE AESI Maximum Grade Related/Not Related to study intervention CAB LA or CAB ULA Action/Dose Change Start Date (Time, if available) End Date (Time, if available)  Right Ventrogluteal Injection Site Tenderness NSAE  1 Related to CAB LA No Change, No action taken 28Feb2025 02Mar2025  Urinary Tract Infection NSAE 2 Not Related to study intervention No change, no action taken 04Mar2025 22Mar2025  Urinary tract infection NSAE 2 Not related to study intervention No change, no action taken 28MAR2025 09APR2025  Abdominal pain NSAE 2 Not related to study intervention No change, no action taken 29MAR2025 09APR2025  Nausea NSAE 2 Not related to study intervention No change, no action taken 29MAR2025 09APR2025  Vomiting NSAE 2 Not related to study intervention No change, no action taken 29MAR2025 09APR2025  Lightheadedness NSAE 2 Not related to study intervention No change, no action taken 29MAR2025 30MAR2025  Hiatal hernia NSAE 2 Not related to study intervention No change, no action taken 29MAR2025   Uterine leiomyoma, unspecified location  NSAE 2 Not related to study intervention No change, no action taken 29MAR2025   Right ventrogluteal injection site tenderness NSAE 1 Related to CAB ULA No change, no action 02JUN2025    Medical History Review  Diagnosis  Grade Start Date End Date  Bradycardia, asymptomatic 1 Unk 2025   Irritable Bowel Syndrome 2 Unk 2023   Intermittent Low Back Pain 2 05Aug2022   Conjunctivitis of Left Eye 2 08Jul2023 17Jul2023  Urinary Tract Infection 2 12Jan2024 17Jan2024  Anemia 2 17Jun2024       []  [x]  Contraception Review: Report  in [facility] Visits: M1, M2, M3, M4, M5, M6, M7, M8, M9, M11, M13, M17, M21,M25, M29, M31, M33, ED, 4WFU, LTFU Visits Female of childbearing potential: Must agree to use a highly effective method of contraception consistently and correctly, must also continue to use adequate contraception methods for at least 52 weeks after the last injection. Female condoms must be used in addition to hormonal contraception Sexual Abstinence: considered a highly effective method only if defined as refraining from heterosexual intercourse during the entire period of risk associated with the study intervention.  The reliability of sexual abstinence needs to be evaluated in relation to the duration of the study and the preferred and usual lifestyle of the participant.   Yes No/NA Completed:  []  [x]  Premature cardiovascular disease: female participant <65 years or female participant <55 years in first degree relatives only.  Note changes since the start of the study.     Months 5, 13, 21, 29      [] No Changes    []  [x]  Substance Usage: (list name of substance, amount used, start and stop dates)  Months 5, 59, 20, 80 Social History   Substance and Sexual Activity  Alcohol Use Not Currently   Alcohol/week: 1.0 standard drink of alcohol   Types: 1 Glasses of wine per week    Social History   Substance and Sexual Activity  Drug Use Yes   Types: Marijuana   Comment: + on urine screen    Tobacco Use: Low Risk  (09/18/2023)   Patient History    Smoking Tobacco Use: Never    Smokeless Tobacco Use: Never    Passive Exposure: Never    Caffeine Use:  caffeine containing beverages/day  []  [x]  Triplicate 12-lead ECG      Visit: M1, M3, M5, M9, M29 Perform in a semi-supine position after 5 minutes of rest.  3 individual ECG tracing should be obtained as closely as possible in succession, but no more than 2 minutes apart. The full set of triplicates should be completed in less than 6 minutes. If ECG abnormal clinically significant  then report as an AE/SAE.  Rest Start Time:  ECG #1 start time:     ECG #2:     ECG #3:    []  [x]  Vital Signs:  [] Weight, BMI (after 5 mins in a semi-supine position)    Only on Visits: M1, M3, M5, M9, M13, M17, M21, M25, M29, M33, ED, 4W FU, LTFU Visits  [] Height    Visit: M9  [] BP and Pulse (measured in a semi supine position after 5 minutes rest)     Visit: M1, M3, M5, M9, M13, M17, M21, M25, M29, M33, ED, 4WFU  [] Temperature and RR:    Visit: M13, ED  Rest Start Time:   Vital Signs Start Time:     There is no height or weight on file to calculate BMI.  There were no vitals filed for this visit.   []  [x]  Brief Symptom Directed Physical Examination: Will include at a minimum, assessments of skin, lungs, CV, abdomen (liver and spleen). Any abnormalities must be noted in the CRF (e.g current medical conditions or AE logs).     Visit: M1, M3, M5, M6, M7, M8, M9, M11, M13, M17, M21, M25, M29, M33, ED, 4W FU, LTFU   Physical Exam    Yes No Blood Draw Tracking:  All Visits  [x]  []  Verified previous Hgb to be >= 10 (if value <10, adjust volume per unit SOP)                          []   NA, healthy volunteer, previous Hgb results are unavailable  [x]  []  Does participant report non-study blood collection in the previous 2 months?  [] Yes, estimated vol. mL    [x]  No  []  [x]  Assess Fasting Status.   Visits: M13, M33                                                      Is participant Fasting for at least 6 hours, overnight fast preferred?  [x]  No   []  Yes, Date/Time of last meal?   [x]  []  Blood Drawn.     Problems?  []  No   [x] Yes, why? Stuck two times  Time: 0858    Total Vol: 2 mL    Yes No Lab Collection:  []  [x]  RAPID HIV TESTING        Visit: M1, M3, M5, M9, M13, M17, M21, M25, M29, M33, ED, 4WFU  [] REACTIVE or [] NON REACTIVE  If positive/reactive participant must be withdrawn from the study even if subsequent confirmatory testing is negative. If reactive participant referred for further testing and clinical management as per local standard of care  Is participant experiencing any signs or symptoms consistent with acute HIV infection  [] Yes [x] No   []  [x]  Chemistry     Visits: M1, M3, M4, M5, M6, M7,M8, M9, M11, M13, M17, M21, M25, M29, M33, ED, 4WFU, LTFU  []  [x]  Hematology     Visits: M1, M3, M4, M5, M6, M7, M8, M9, M11, M13, M17, M21, M25, M29, M33, ED, 4WFU, LTFU  []  [x]  Non rapid HIV immunoassay    Visits: M1, M3, M5, M9, M13, M17, M21, M25, M29, M33, ED, 4WFU LTFU  []  [x]  HIV-1 RNA     Visits: M1, M3, M5, M9, M13, M17, M21, M25, M29, M33, ED, 4WFU, LTFU  []  [x]  Neisseria gonorrhea (GC), Chlamydia trachomatis (CT), Trichomononas vaginalis (TV), syphilis.  If positive we will refer for treatment as per local guidelines.   Visit: M1, M3, M5, M9, M13, M17, M21, M25, M29, M33, ED, 4WFU, LTFU, PRN   **PRN if symptomatic at other visits**  [] GC/CT NAAT urine/vaginal swab  *Rectal and oropharyngeal swab can be collected based on reported sexual history. [] GC and CT: Rectal Swab [] Oropharyngeal Swab [] TV: vaginal Swab  *If female and menstruating may take sample at next visit  []  [x]  Coagulation Tests     Visit: M13, M33  []  [x]  Hepatitis B&C     Visit: M13, M25, ED  []  [x]  Fasting Labs: Glucose     Visit: M13 and M33  (Insulin, HbA1c only collect if withdrawal visit occurs at M13 or M33)  []  [x]  Plasma for Storage     Visits: M1, M2, M3,M4, M5, M6, M7,M8, M9, M11, M13, M17, M21, M25, M29, ED, LTFU Visits  []  [x]  Predose: PK Sampling  Collect < 1 hr before injection Visits: M1, M3, M5, M9, M13, M17, M21, M25, M29  []  [x]  Illicit Drug Screen     Visits: M5,  M13, M21, M29, M33  []  [x]  Urinalysis: Morning specimen is preferred     Visits: M13, M33, ED, 4WFU  Time of Collection:   LMP: Patient's last menstrual period was 09/02/2023 (exact date).  []  N/A   Yes No/NA Lab Collection, Cont.:  []  [x]  Urine Pregnancy Test (POCBP only)  Visits: M1, M3, M5,  M9, M13, M17, M21, M25, M29, M33 (resulted prior to receipt of IP), ED, 4WFU, LTFU   Time of Collection:     [] NA, assigned Female at birth or Not a POCBP  []                          [x]  Serum Pregnancy Test (POCBP only) only if urine test positive, perform a serum test to confirm.   Yes No/NA Study Drug Administration  []  [x]  CAB LA Administration:     Visit: M1        Needle Size: [] 1.5"  [] 2" (For BMI >/= 30)   There is no height or weight on file to calculate BMI. Gauge: [] 21G [] 22G [] 23G  Site: [] Left Ventrogluteal  [] Right Ventrogluteal  Time of Administration:    []  [x]  CAB ULA Administration:   Visit: M3, M5, M9, M13, M17, M21, M25, M29  Sites should be rotated between right and left Needle Size: [] 1.5"  [] 2" (For BMI >/= 30)   There is no height or weight on file to calculate BMI.  Gauge: [] 21G [] 22G [] 23G  Site: [] Left Ventrogluteal  [] Right Ventrogluteal  Time of Administration:    [x]  []  ISR Review completed, ISR photography necessary [] Yes[x] No     All Visits *Digital photographs will be documented at all visits (scheduled or unscheduled) on all participants who have an injection site reaction that is a visible, persistent Grade 2 (no improvement/resolution in >10 days), Grade >/=3, or serious. *Examination will include a physician/HCP assessment of pain (or tenderness), pruritus, warmth, infections, rash, erythema (or redness), swelling (or induration), and nodules (granulomas or cysts)  *Remote Visits: M6+2W, M7+2W, M8+2W, M10, M12, M15, M19, M23, M27, M31 should assess whether participants have any new ISR symptoms/signs since their last visit or have previously reported ISR that  has not improved or is worsening, if present site should perform an unscheduled visits ASAP for ISR assessment   Yes No/NA  Pharmacokinetic Sampling  [x]  []   Post Dose PK sampling: 2 hours after injection on dosing days(+/- 30 mins collection window Visits:1 week, M1, M1+1Wk, M2, M3, M3+1Wk, M4, M5, M5+1W, M6, M7, M8, M9, , M9+1Wk, M11, M13, M33, ED, 4WFU, LTFU     Time of PK Collection: 0858    Yes No Completed:  [x]  []   HIV/STI  Counseling/Condom Distribution:    All Visits, except remote calls [] Accepted  [x] Declined   Yes No/NA Completed  []  [x]  Was there an ISR during remote phone call visit:  [] Yes [x]  No If yes, schedule unscheduled ISR assessment ASAP Date:   [x]  []  Instructed to contact site if ISR gets worse or does not improve after 10 days from the last ISR assessment.  [x]  []  Assess for Protocol Deviations  [x]  []  Reminders:  Participants of childbearing potential agree to use a highly effective method of contraception consistently and correctly HIV vaccines are not permitted at any time during the study No other experimental agents, antiretroviral drugs, cytotoxic chemotherapy, or radiation therapy my not be administered throughout the trial No systemically administered immunomodulator's permitted Notify study staff if you experience any signs/symptoms, visit provider in ER or scheduled evaluation, or start any new OTC, supplemental or prescription therapies   Yes No Completed:  [x]  []  Compensation:  You will receive a payment of $78.00 at the end of each non dosing visit completed to reimburse you for your time and effort.  You will receive $125.00 at the end of  each dosing visit completed to reimburse you for your time and effort.   If you must return to the clinic for an unscheduled visit, you will receive $78.   Amount: $78.00   [x]  []  Schedule Next Visit: Visits should be planned using a calendar months and scheduled based on the date of the Day 1 injection.     Remote Call Visits schedule on M6+2 wk, M7+2wk, M8+2wk, M10, M12, M15, M19, M23, M27, M31 (Assess ISR, AES, Conmeds)  If transitioning to CAB 200mg /ml do not need LTFU visits, but do need ED visit and 4 week followup visit.  Date: 30JUN2025     Time: 0830   Version 2.1 01May2025 JLS

## 2023-10-06 ENCOUNTER — Telehealth: Payer: Self-pay | Admitting: Nurse Practitioner

## 2023-10-06 NOTE — Telephone Encounter (Signed)
 Left vm and sent mychart message to confirm 10/13/23 appointment-Toni

## 2023-10-12 ENCOUNTER — Encounter: Payer: Self-pay | Admitting: Nurse Practitioner

## 2023-10-13 ENCOUNTER — Encounter: Payer: Self-pay | Admitting: Nurse Practitioner

## 2023-10-19 ENCOUNTER — Encounter

## 2023-10-19 ENCOUNTER — Other Ambulatory Visit: Payer: Self-pay

## 2023-10-19 DIAGNOSIS — Z006 Encounter for examination for normal comparison and control in clinical research program: Secondary | ICD-10-CM

## 2023-10-19 NOTE — Research (Signed)
 ROBERTS [id number]                                                  Protocol Amendment 02  [facility]:  [phone]                                                                  TREATMENT PHASE  Visit: []  Day1+1W     []  M1    []  M1+1W    []  M2    []  M3    []  M3+1W    [x]  M4    [] M5     [] M5+1W   [] M6   [] M6+2W   [] M7   [] M7+2W   [] M8   [] M8+2W   [] M9 [] M9+1W   [] M10   [] M11   [] M12   [] M13   [] M15    [] M17   [] M19   [] M21   [] M23   [] M25   [] M27   [] M29   [] M31   [] M33   [] ED  [] 4 wk followup (if not entering LTFU)   [] LTFU M1   [] LTFU M4   [] LTFU M8   [] LTFU M12     Date: 30JUN2025         4 Letter Code: MELO Subject ID: [id number]  Assessments should occur in the following order:  eCSSRs  All other questionnaires  Triplicate 12-lead ECG  Vital signs  Blood draws (clinical laboratory tests, pre-dose pharmacokinetics, etc)  IP injection  Post-dose pharmacokinetics (if required)  Yes No Completed:  [x]  []  Verify participant identity before doing any study procedures   All Visits  [x]  []  Verify correct version of ICF is signed   All Visits   Yes No Questionnaires Completed: Patient Reported Outcome Assessment must be completed before other assessments take place at each designated visit. Completed electronically by participants.  []  [x]  eCSSRs    Visits: M1, M3, M5, M9, M13, M17, M21, M25, M29, M33, ED Any positive  (abnormal) response indicating SIB or any unusual changes in behavior, confirmed by the investigator will result in their discontinuation of study intervention, the PI/SI will arrange for urgent specialist psychiatric evaluation and management  []  [x]  Social Determinants of Health Questionnaire (SDoH)     Visits: M17, ED  []  [x]  Study Medication Satisfaction Questionnaire (SMSQs)     Visits: M1, M3, M5, M9, M13, M17, M21, M25, M29, M33, ED  []  [x]  PrEP Preference and Rationale Questionnaire (only if prior exposure to oral PrEP)     Visits: M9, M17, M25, M33, ED  []  [x]   Acceptability of Injection Questionnaire     Visits: M1, M3, M5, M9, M13, M17, M21, M25, M29, M33, ED  []  [x]  Generalized Anxiety Disorders (GAD-&) Questionnaire     Visits: M1, M3, M5, M9, M13, M17, M21, M25, M29, M33, ED  []  [x]  HIV-risk and PrEP use Anxiety and Stigma Questionnaire prior to the study drug administration     Visits: M17, M29, M33, ED  []  [x]  Sexual Health Practices Questionnaire-administer prior to study drug administration Visits: M1, M3, M5, M9, M13, M17, M21, M25, M29, M33, ED  Yes No Completed:  [x]  []  Review Changes in Medical History    All Visits  [x]  []  Review Changes in Concomitant Medications    All Visits  [x]  []  Assess for AEs, SAEs: All AEs (from Day 1 onwards) and SAEs (from Screening onwards) must be recorded in eCRF at all visits and reported within required timelines.    All Visits  Is participant experiencing any symptomatic STIs currently? [] Yes [x] No    [x]  []  Pull Review in: Medication Review Medication Indication Dose/Route/Frequency Start Date End Date  Magnesium Glycinate Prophylaxis 50mg  PO QD 01Dec2024 31MAR2025  Nicotinamide Riboside Chloride Powder Prophylaxis 1 Capsule QD 01Dec2024 31MAR2025  Resveratrol Prophylaxis 1 Capsule QD 01Dec2024 31MAR2025  Cephalexin Urinary Tract Infection 500 mg tab QD 06Mar2025 11Mar2025  Amoxicillin   Urinary Tract Infection 875 mg tab BID 12Mar2025 22Mar2025  phenazopyridine  (PYRIDIUM )  Urinary Tract Infection 200 MG tablet TID 12Mar2025 17Mar2025  phenazopyridine  (PYRIDIUM )  Urinary Tract Infection 200 MG tablet TID 28mar2025 09APR2025  Ciprofloxacin  (Cipro ) Urinary tract infection 500 mg PO BID 28MAR2025 02APR2025  nitrofurantoin , macrocrystal-monohydrate, (MACROBID )  Urinary Tract Infection 100 mg PO BID  02APR2025 09APR2025  famotidine  (PEPCID )  nausea 20 mg PO BID 29MAR2025   iohexol  (OMNIPAQUE ) 9 MG/ML Oral contrast Abdominal pain  oral solution 500 mL once 29MAR2025 29MAR2025  alum & mag  hydroxide-simeth (MAALOX/MYLANTA)  200-200-20 MG/5ML  nausea Oral suspension 30 mL once 29MAR2025 29MAR2025  lidocaine  (XYLOCAINE ) 2 %  nausea 15 mL PO once 29MAR2025 29MAR2025  lactated ringers  bolus 1,000 mL  lightheadedness 1,000 mL Intravenous once 29MAR2025 29MAR2025  ondansetron  (ZOFRAN )   nausea 4 mg IV once  29MAR2025 29MAR2025  ketorolac  (TORADOL )  Abdominal pain 15 mg Intravenous once 29MAR2025 29MAR2025  iohexol  (OMNIPAQUE ) 300 MG/ML Intravenous Contrast  Abdominal pain  80 mL IV once 29MAR2025 29MAR2025  ibuprofen Right ventrogluteal injection site tenderness 200mg  PO Q8 hours 02JUN2025 09JUN2025   AE Review Diagnosis/Symptoms Indicate: NSAE SAE AESI Maximum Grade Related/Not Related to study intervention CAB LA or CAB ULA Action/Dose Change Start Date (Time, if available) End Date (Time, if available)  Right Ventrogluteal Injection Site Tenderness NSAE  1 Related to CAB LA No Change, No action taken 28Feb2025 02Mar2025  Urinary Tract Infection NSAE 2 Not Related to study intervention No change, no action taken 04Mar2025 22Mar2025  Urinary tract infection NSAE 2 Not related to study intervention No change, no action taken 28MAR2025 09APR2025  Abdominal pain NSAE 2 Not related to study intervention No change, no action taken 29MAR2025 09APR2025  Nausea NSAE 2 Not related to study intervention No change, no action taken 29MAR2025 09APR2025  Vomiting NSAE 2 Not related to study intervention No change, no action taken 29MAR2025 09APR2025  Lightheadedness NSAE 2 Not related to study intervention No change, no action taken 29MAR2025 30MAR2025  Hiatal hernia NSAE 2 Not related to study intervention No change, no action taken 29MAR2025   Uterine leiomyoma, unspecified location  NSAE 2 Not related to study intervention No change, no action taken 29MAR2025   Right ventrogluteal injection site tenderness NSAE 1 Related to CAB ULA No change, no action 02JUN2025 09JUN2025   Medical History  Review  Diagnosis Grade Start Date End Date  Bradycardia, asymptomatic 1 Unk 2025   Irritable Bowel Syndrome 2 Unk 2023   Intermittent Low Back Pain 2 05Aug2022   Conjunctivitis of Left Eye 2 08Jul2023 17Jul2023  Urinary Tract Infection 2 12Jan2024 17Jan2024  Anemia 2 17Jun2024       [x]  []  Contraception Review: Report  in [facility] Visits: M1, M2, M3, M4, M5, M6, M7, M8, M9, M11, M13, M17, M21,M25, M29, M31, M33, ED, 4WFU, LTFU Visits Female of childbearing potential: Must agree to use a highly effective method of contraception consistently and correctly, must also continue to use adequate contraception methods for at least 52 weeks after the last injection. Female condoms must be used in addition to hormonal contraception Sexual Abstinence: considered a highly effective method only if defined as refraining from heterosexual intercourse during the entire period of risk associated with the study intervention.  The reliability of sexual abstinence needs to be evaluated in relation to the duration of the study and the preferred and usual lifestyle of the participant.   Yes No/NA Completed:  []  [x]  Premature cardiovascular disease: female participant <65 years or female participant <55 years in first degree relatives only.  Note changes since the start of the study.     Months 5, 13, 21, 29      [] No Changes    []  [x]  Substance Usage: (list name of substance, amount used, start and stop dates)  Months 5, 76, 13, 80 Social History   Substance and Sexual Activity  Alcohol Use Not Currently   Alcohol/week: 1.0 standard drink of alcohol   Types: 1 Glasses of wine per week    Social History   Substance and Sexual Activity  Drug Use Yes   Types: Marijuana   Comment: + on urine screen    Tobacco Use: Low Risk  (09/25/2023)   Patient History    Smoking Tobacco Use: Never    Smokeless Tobacco Use: Never    Passive Exposure: Never    Caffeine Use:  caffeine containing beverages/day  []  [x]   Triplicate 12-lead ECG     Visit: M1, M3, M5, M9, M29 Perform in a semi-supine position after 5 minutes of rest.  3 individual ECG tracing should be obtained as closely as possible in succession, but no more than 2 minutes apart. The full set of triplicates should be completed in less than 6 minutes. If ECG abnormal clinically significant  then report as an AE/SAE.  Rest Start Time:  ECG #1 start time:     ECG #2:     ECG #3:    []  [x]  Vital Signs:  [] Weight, BMI (after 5 mins in a semi-supine position)    Only on Visits: M1, M3, M5, M9, M13, M17, M21, M25, M29, M33, ED, 4W FU, LTFU Visits  [] Height    Visit: M9  [] BP and Pulse (measured in a semi supine position after 5 minutes rest)     Visit: M1, M3, M5, M9, M13, M17, M21, M25, M29, M33, ED, 4WFU  [] Temperature and RR:    Visit: M13, ED  Rest Start Time:   Vital Signs Start Time:     There is no height or weight on file to calculate BMI.  There were no vitals filed for this visit.   []  [x]  Brief Symptom Directed Physical Examination: Will include at a minimum, assessments of skin, lungs, CV, abdomen (liver and spleen). Any abnormalities must be noted in the CRF (e.g current medical conditions or AE logs).     Visit: M1, M3, M5, M6, M7, M8, M9, M11, M13, M17, M21, M25, M29, M33, ED, 4W FU, LTFU   Physical Exam    Yes No Blood Draw Tracking:  All Visits  [x]  []  Verified previous Hgb to be >= 10 (if value <10, adjust volume per unit SOP)                          []   NA, healthy volunteer, previous Hgb results are unavailable  [x]  []  Does participant report non-study blood collection in the previous 2 months?  [] Yes, estimated vol.    [x]  No  []  [x]  Assess Fasting Status.   Visits: M13, M33                                                      Is participant Fasting for at least 6 hours, overnight fast preferred?  [x]  No   []  Yes, Date/Time of last meal?   [x]  []  Blood Drawn.     Problems?  [x]  No   [] Yes, why?   Time: 0846    Total  Vol: 14 mL   Yes No Lab Collection:  []  [x]  RAPID HIV TESTING        Visit: M1, M3, M5, M9, M13, M17, M21, M25, M29, M33, ED, 4WFU  [] REACTIVE or [] NON REACTIVE  If positive/reactive participant must be withdrawn from the study even if subsequent confirmatory testing is negative. If reactive participant referred for further testing and clinical management as per local standard of care  Is participant experiencing any signs or symptoms consistent with acute HIV infection  [] Yes [x] No   [x]  []  Chemistry     Visits: M1, M3, M4, M5, M6, M7,M8, M9, M11, M13, M17, M21, M25, M29, M33, ED, 4WFU, LTFU  [x]  []  Hematology     Visits: M1, M3, M4, M5, M6, M7, M8, M9, M11, M13, M17, M21, M25, M29, M33, ED, 4WFU, LTFU  []  [x]  Non rapid HIV immunoassay    Visits: M1, M3, M5, M9, M13, M17, M21, M25, M29, M33, ED, 4WFU LTFU  []  [x]  HIV-1 RNA     Visits: M1, M3, M5, M9, M13, M17, M21, M25, M29, M33, ED, 4WFU, LTFU  []  [x]  Neisseria gonorrhea (GC), Chlamydia trachomatis (CT), Trichomononas vaginalis (TV), syphilis.  If positive we will refer for treatment as per local guidelines.   Visit: M1, M3, M5, M9, M13, M17, M21, M25, M29, M33, ED, 4WFU, LTFU, PRN   **PRN if symptomatic at other visits**  [] GC/CT NAAT urine/vaginal swab  *Rectal and oropharyngeal swab can be collected based on reported sexual history. [] GC and CT: Rectal Swab [] Oropharyngeal Swab [] TV: vaginal Swab  *If female and menstruating may take sample at next visit  []  [x]  Coagulation Tests     Visit: M13, M33  []  [x]  Hepatitis B&C     Visit: M13, M25, ED  []  [x]  Fasting Labs: Glucose     Visit: M13 and M33  (Insulin, HbA1c only collect if withdrawal visit occurs at M13 or M33)  [x]  []  Plasma for Storage     Visits: M1, M2, M3,M4, M5, M6, M7,M8, M9, M11, M13, M17, M21, M25, M29, ED, LTFU Visits  []  [x]  Predose: PK Sampling  Collect < 1 hr before injection Visits: M1, M3, M5, M9, M13, M17, M21, M25, M29  []  [x]  Illicit Drug Screen      Visits: M5, M13, M21, M29, M33  []  [x]  Urinalysis: Morning specimen is preferred     Visits: M13, M33, ED, 4WFU  Time of Collection:   LMP: No LMP recorded.  []  N/A   Yes No/NA Lab Collection, Cont.:  []  [x]  Urine Pregnancy Test (POCBP only)  Visits: M1, M3, M5, M9, M13, M17, M21, M25, M29, M33 (resulted  prior to receipt of IP), ED, 4WFU, LTFU   Time of Collection:     [] NA, assigned Female at birth or Not a POCBP  []                          [x]  Serum Pregnancy Test (POCBP only) only if urine test positive, perform a serum test to confirm.   Yes No/NA Study Drug Administration  []  [x]  CAB LA Administration:     Visit: M1        Needle Size: [] 1.5  [] 2 (For BMI >/= 30)   There is no height or weight on file to calculate BMI. Gauge: [] 21G [] 22G [] 23G  Site: [] Left Ventrogluteal  [] Right Ventrogluteal  Time of Administration:    []  [x]  CAB ULA Administration:   Visit: M3, M5, M9, M13, M17, M21, M25, M29  Sites should be rotated between right and left Needle Size: [] 1.5  [] 2 (For BMI >/= 30)   There is no height or weight on file to calculate BMI.  Gauge: [] 21G [] 22G [] 23G  Site: [] Left Ventrogluteal  [] Right Ventrogluteal  Time of Administration:    [x]  []  ISR Review completed, ISR photography necessary [] Yes[x] No     All Visits *Digital photographs will be documented at all visits (scheduled or unscheduled) on all participants who have an injection site reaction that is a visible, persistent Grade 2 (no improvement/resolution in >10 days), Grade >/=3, or serious. *Examination will include a physician/HCP assessment of pain (or tenderness), pruritus, warmth, infections, rash, erythema (or redness), swelling (or induration), and nodules (granulomas or cysts)  *Remote Visits: M6+2W, M7+2W, M8+2W, M10, M12, M15, M19, M23, M27, M31 should assess whether participants have any new ISR symptoms/signs since their last visit or have previously reported ISR that has not improved or is worsening,  if present site should perform an unscheduled visits ASAP for ISR assessment   Yes No/NA  Pharmacokinetic Sampling  [x]  []   Post Dose PK sampling: 2 hours after injection on dosing days(+/- 30 mins collection window Visits:1 week, M1, M1+1Wk, M2, M3, M3+1Wk, M4, M5, M5+1W, M6, M7, M8, M9, , M9+1Wk, M11, M13, M33, ED, 4WFU, LTFU     Time of PK Collection: 0846    Yes No Completed:  [x]  []   HIV/STI  Counseling/Condom Distribution:    All Visits, except remote calls [] Accepted  [x] Declined   Yes No/NA Completed  []  [x]  Was there an ISR during remote phone call visit:  [] Yes  [x] No If yes, schedule unscheduled ISR assessment ASAP Date:   [x]  []  Instructed to contact site if ISR gets worse or does not improve after 10 days from the last ISR assessment.  [x]  []  Assess for Protocol Deviations  [x]  []  Reminders:  Participants of childbearing potential agree to use a highly effective method of contraception consistently and correctly HIV vaccines are not permitted at any time during the study No other experimental agents, antiretroviral drugs, cytotoxic chemotherapy, or radiation therapy my not be administered throughout the trial No systemically administered immunomodulator's permitted Notify study staff if you experience any signs/symptoms, visit provider in ER or scheduled evaluation, or start any new OTC, supplemental or prescription therapies   Yes No Completed:  [x]  []  Compensation:  You will receive a payment of $78.00 at the end of each non dosing visit completed to reimburse you for your time and effort.  You will receive $125.00 at the end of each dosing visit completed to reimburse you for  your time and effort.   If you must return to the clinic for an unscheduled visit, you will receive $78.   Amount: $78.00   [x]  []  Schedule Next Visit: Visits should be planned using a calendar months and scheduled based on the date of the Day 1 injection.    Remote Call Visits schedule on  M6+2 wk, M7+2wk, M8+2wk, M10, M12, M15, M19, M23, M27, M31 (Assess ISR, AES, Conmeds)  If transitioning to CAB 200mg /ml do not need LTFU visits, but do need ED visit and 4 week followup visit.  Date: 28JUL2025     Time: 0830   Version 2.1 01May2025 JLS

## 2023-10-26 NOTE — Progress Notes (Signed)
 I have reviewed pertinent findings including labs for participants M3 + 1 week visit.

## 2023-10-27 NOTE — Research (Signed)
 SABRA

## 2023-11-16 ENCOUNTER — Other Ambulatory Visit: Payer: Self-pay

## 2023-11-16 VITALS — BP 105/70 | HR 79 | Wt 134.9 lb

## 2023-11-16 DIAGNOSIS — Z006 Encounter for examination for normal comparison and control in clinical research program: Secondary | ICD-10-CM

## 2023-11-16 MED ORDER — STUDY - EXTEND - CABOTEGRAVIR ULA (GSK1265744) 1600 MG IM INJECTION (PI-VAN DAM)
1600.0000 mg | INJECTION | INTRAMUSCULAR | Status: AC
  Administered 2023-11-16: 1600 mg via INTRAMUSCULAR
  Filled 2023-11-16: qty 3

## 2023-11-16 NOTE — Research (Signed)
 [facility]ERTS [id number]                                                  Protocol Amendment 02  [facility]:  [phone]                                                                  TREATMENT PHASE  Visit: []  Day1+1W     []  M1    []  M1+1W    []  M2    []  M3    []  M3+1W    []  M4    [x] M5     [] M5+1W   [] M6   [] M6+2W   [] M7   [] M7+2W   [] M8   [] M8+2W   [] M9 [] M9+1W   [] M10   [] M11   [] M12   [] M13   [] M15    [] M17   [] M19   [] M21   [] M23   [] M25   [] M27   [] M29   [] M31   [] M33   [] ED  [] 4 wk followup (if not entering LTFU)   [] LTFU M1   [] LTFU M4   [] LTFU M8   [] LTFU M12     Date: 28JUL2025         4 Letter Code: MELO Subject ID: [id number]  Assessments should occur in the following order:  eCSSRs  All other questionnaires  Triplicate 12-lead ECG  Vital signs  Blood draws (clinical laboratory tests, pre-dose pharmacokinetics, etc)  IP injection  Post-dose pharmacokinetics (if required)  Yes No Completed:  [x]  []  Verify participant identity before doing any study procedures   All Visits  [x]  []  Verify correct version of ICF is signed   All Visits   Yes No Questionnaires Completed: Patient Reported Outcome Assessment must be completed before other assessments take place at each designated visit. Completed electronically by participants.  [x]  []  eCSSRs    Visits: M1, M3, M5, M9, M13, M17, M21, M25, M29, M33, ED Any positive  (abnormal) response indicating SIB or any unusual changes in behavior, confirmed by the investigator will result in their discontinuation of study intervention, the PI/SI will arrange for urgent specialist psychiatric evaluation and management  []  [x]  Social Determinants of Health Questionnaire (SDoH)     Visits: M17, ED  [x]  []  Study Medication Satisfaction Questionnaire (SMSQs)     Visits: M1, M3, M5, M9, M13, M17, M21, M25, M29, M33, ED  []  [x]  PrEP Preference and Rationale Questionnaire (only if prior exposure to oral PrEP)     Visits: M9, M17, M25, M33, ED  [x]  []   Acceptability of Injection Questionnaire     Visits: M1, M3, M5, M9, M13, M17, M21, M25, M29, M33, ED  [x]  []  Generalized Anxiety Disorders (GAD-&) Questionnaire     Visits: M1, M3, M5, M9, M13, M17, M21, M25, M29, M33, ED  []  [x]  HIV-risk and PrEP use Anxiety and Stigma Questionnaire prior to the study drug administration     Visits: M17, M29, M33, ED  [x]  []  Sexual Health Practices Questionnaire-administer prior to study drug administration Visits: M1, M3, M5, M9, M13, M17, M21, M25, M29, M33, ED  Yes No Completed:  [x]  []  Review Changes in Medical History    All Visits  [x]  []  Review Changes in Concomitant Medications    All Visits  [x]  []  Assess for AEs, SAEs: All AEs (from Day 1 onwards) and SAEs (from Screening onwards) must be recorded in eCRF at all visits and reported within required timelines.    All Visits  Is participant experiencing any symptomatic STIs currently? [] Yes [x] No    [x]  []  Pull Review in: Medication Review Medication Indication Dose/Route/Frequency Start Date End Date  Magnesium Glycinate Prophylaxis 50mg  PO QD 01Dec2024 31MAR2025  Nicotinamide Riboside Chloride Powder Prophylaxis 1 Capsule QD 01Dec2024 31MAR2025  Resveratrol Prophylaxis 1 Capsule QD 01Dec2024 31MAR2025  Cephalexin Urinary Tract Infection 500 mg tab QD 06Mar2025 11Mar2025  Amoxicillin   Urinary Tract Infection 875 mg tab BID 12Mar2025 22Mar2025  phenazopyridine  (PYRIDIUM )  Urinary Tract Infection 200 MG tablet TID 12Mar2025 17Mar2025  phenazopyridine  (PYRIDIUM )  Urinary Tract Infection 200 MG tablet TID 28mar2025 09APR2025  Ciprofloxacin  (Cipro ) Urinary tract infection 500 mg PO BID 28MAR2025 02APR2025  nitrofurantoin , macrocrystal-monohydrate, (MACROBID )  Urinary Tract Infection 100 mg PO BID  02APR2025 09APR2025  famotidine  (PEPCID )  nausea 20 mg PO BID 29MAR2025   iohexol  (OMNIPAQUE ) 9 MG/ML Oral contrast Abdominal pain  oral solution 500 mL once 29MAR2025 29MAR2025  alum & mag  hydroxide-simeth (MAALOX/MYLANTA)  200-200-20 MG/5ML  nausea Oral suspension 30 mL once 29MAR2025 29MAR2025  lidocaine  (XYLOCAINE ) 2 %  nausea 15 mL PO once 29MAR2025 29MAR2025  lactated ringers  bolus 1,000 mL  lightheadedness 1,000 mL Intravenous once 29MAR2025 29MAR2025  ondansetron  (ZOFRAN )   nausea 4 mg IV once  29MAR2025 29MAR2025  ketorolac  (TORADOL )  Abdominal pain 15 mg Intravenous once 29MAR2025 29MAR2025  iohexol  (OMNIPAQUE ) 300 MG/ML Intravenous Contrast  Abdominal pain  80 mL IV once 29MAR2025 29MAR2025  ibuprofen Right ventrogluteal injection site tenderness 200mg  PO Q8 hours 02JUN2025 09JUN2025   AE Review Diagnosis/Symptoms Indicate: NSAE SAE AESI Maximum Grade Related/Not Related to study intervention CAB LA or CAB ULA Action/Dose Change Start Date (Time, if available) End Date (Time, if available)  Right Ventrogluteal Injection Site Tenderness NSAE  1 Related to CAB LA No Change, No action taken 28Feb2025 02Mar2025  Urinary Tract Infection NSAE 2 Not Related to study intervention No change, no action taken 04Mar2025 22Mar2025  Urinary tract infection NSAE 2 Not related to study intervention No change, no action taken 28MAR2025 09APR2025  Abdominal pain NSAE 2 Not related to study intervention No change, no action taken 29MAR2025 09APR2025  Nausea NSAE 2 Not related to study intervention No change, no action taken 29MAR2025 09APR2025  Vomiting NSAE 2 Not related to study intervention No change, no action taken 29MAR2025 09APR2025  Lightheadedness NSAE 2 Not related to study intervention No change, no action taken 29MAR2025 30MAR2025  Hiatal hernia NSAE 2 Not related to study intervention No change, no action taken 29MAR2025   Uterine leiomyoma, unspecified location  NSAE 2 Not related to study intervention No change, no action taken 29MAR2025   Right ventrogluteal injection site tenderness NSAE 1 Related to CAB ULA No change, no action 02JUN2025 09JUN2025   Medical History  Review  Diagnosis Grade Start Date End Date  Bradycardia, asymptomatic 1 Unk 2025   Irritable Bowel Syndrome 2 Unk 2023   Intermittent Low Back Pain 2 05Aug2022   Conjunctivitis of Left Eye 2 08Jul2023 17Jul2023  Urinary Tract Infection 2 12Jan2024 17Jan2024  Anemia 2 17Jun2024       [x]  []  Contraception Review: Report  in [facility] Visits: M1, M2, M3, M4, M5, M6, M7, M8, M9, M11, M13, M17, M21,M25, M29, M31, M33, ED, 4WFU, LTFU Visits Female of childbearing potential: Must agree to use a highly effective method of contraception consistently and correctly, must also continue to use adequate contraception methods for at least 52 weeks after the last injection. Female condoms must be used in addition to hormonal contraception Sexual Abstinence: considered a highly effective method only if defined as refraining from heterosexual intercourse during the entire period of risk associated with the study intervention.  The reliability of sexual abstinence needs to be evaluated in relation to the duration of the study and the preferred and usual lifestyle of the participant.   Yes No/NA Completed:  [x]  []  Premature cardiovascular disease: female participant <65 years or female participant <55 years in first degree relatives only.  Note changes since the start of the study.     Months 5, 13, 21, 29      [x] No Changes    [x]  []  Substance Usage: (list name of substance, amount used, start and stop dates)  Months 5, 38, 49, 71 Social History   Substance and Sexual Activity  Alcohol Use Not Currently   Alcohol/week: 1.0 standard drink of alcohol   Types: 1 Glasses of wine per week    Social History   Substance and Sexual Activity  Drug Use Yes   Types: Marijuana   Comment: + on urine screen    Tobacco Use: Low Risk  (11/16/2023)   Patient History    Smoking Tobacco Use: Never    Smokeless Tobacco Use: Never    Passive Exposure: Never    Caffeine Use: 2 caffeine containing beverages/day  [x]  []   Triplicate 12-lead ECG     Visit: M1, M3, M5, M9, M29 Perform in a semi-supine position after 5 minutes of rest.  3 individual ECG tracing should be obtained as closely as possible in succession, but no more than 2 minutes apart. The full set of triplicates should be completed in less than 6 minutes. If ECG abnormal clinically significant  then report as an AE/SAE.  Rest Start Time: 0915 ECG #1 start time: 0926    ECG #2: 0928    ECG #3: 0928   [x]  []  Vital Signs:  [x] Weight, BMI (after 5 mins in a semi-supine position)    Only on Visits: M1, M3, M5, M9, M13, M17, M21, M25, M29, M33, ED, 4W FU, LTFU Visits  [] Height    Visit: M9  [x] BP and Pulse (measured in a semi supine position after 5 minutes rest)     Visit: M1, M3, M5, M9, M13, M17, M21, M25, M29, M33, ED, 4WFU  [] Temperature and RR:    Visit: M13, ED  Rest Start Time: 9071  Vital Signs Start Time: 0933  Last Weight  Most recent update: 11/16/2023  8:58 AM    Weight  61.2 kg (134 lb 14.7 oz)             Body mass index is 26.35 kg/m.  Vitals:   11/16/23 0933  BP: 105/70  Pulse: 79     [x]  []  Brief Symptom Directed Physical Examination: Will include at a minimum, assessments of skin, lungs, CV, abdomen (liver and spleen). Any abnormalities must be noted in the CRF (e.g current medical conditions or AE logs).     Visit: M1, M3, M5, M6, M7, M8, M9, M11, M13, M17, M21, M25, M29, M33, ED, 4W FU, LTFU   Physical Exam  Constitutional:      Appearance: Normal appearance. She is normal weight.  Cardiovascular:     Rate and Rhythm: Normal rate and regular rhythm.  Abdominal:     General: Abdomen is flat. Bowel sounds are normal. There is no distension.     Palpations: Abdomen is soft.     Tenderness: There is no abdominal tenderness.  Musculoskeletal:        General: Normal range of motion.  Skin:    General: Skin is warm and dry.  Neurological:     Mental Status: She is alert.  Psychiatric:        Mood and Affect: Mood  normal.        Behavior: Behavior normal.        Thought Content: Thought content normal.        Judgment: Judgment normal.                                                                                                                                                                                                                          Yes No Blood Draw Tracking:  All Visits  [x]  []  Verified previous Hgb to be >= 10 (if value <10, adjust volume per unit SOP)                          [] NA, healthy volunteer, previous Hgb results are unavailable  [x]  []  Does participant report non-study blood collection in the previous 2 months?  [] Yes, estimated vol. mL    [x]  No  [x]  []  Assess Fasting Status.   Visits: M13, M33                                                      Is participant Fasting for at least 6 hours, overnight fast preferred?  [x]  No   []  Yes, Date/Time of last meal?   [x]  []  Blood Drawn.     Problems?  [x]  No   [] Yes, why?   Time: 0942    Total Vol: 36 mL   Yes No Lab Collection:  [x]  []  RAPID HIV TESTING        Visit: M1, M3, M5, M9, M13, M17, M21, M25, M29, M33, ED, 4WFU  [] REACTIVE or [x] NON REACTIVE  If  positive/reactive participant must be withdrawn from the study even if subsequent confirmatory testing is negative. If reactive participant referred for further testing and clinical management as per local standard of care  Is participant experiencing any signs or symptoms consistent with acute HIV infection  [] Yes [x] No   [x]  []  Chemistry     Visits: M1, M3, M4, M5, M6, M7,M8, M9, M11, M13, M17, M21, M25, M29, M33, ED, 4WFU, LTFU  [x]  []  Hematology     Visits: M1, M3, M4, M5, M6, M7, M8, M9, M11, M13, M17, M21, M25, M29, M33, ED, 4WFU, LTFU  [x]  []  Non rapid HIV immunoassay    Visits: M1, M3, M5, M9, M13, M17, M21, M25, M29, M33, ED, 4WFU LTFU  [x]  []  HIV-1 RNA     Visits: M1, M3, M5, M9, M13, M17, M21, M25, M29, M33, ED, 4WFU, LTFU  [x]  []  Neisseria gonorrhea (GC),  Chlamydia trachomatis (CT), Trichomononas vaginalis (TV), syphilis.  If positive we will refer for treatment as per local guidelines.   Visit: M1, M3, M5, M9, M13, M17, M21, M25, M29, M33, ED, 4WFU, LTFU, PRN   **PRN if symptomatic at other visits**  [x] GC/CT NAAT urine/vaginal swab  *Rectal and oropharyngeal swab can be collected based on reported sexual history. [x] GC and CT: Rectal Swab [x] Oropharyngeal Swab [x] TV: vaginal Swab  *If female and menstruating may take sample at next visit  []  [x]  Coagulation Tests     Visit: M13, M33  []  [x]  Hepatitis B&C     Visit: M13, M25, ED  []  [x]  Fasting Labs: Glucose     Visit: M13 and M33  (Insulin, HbA1c only collect if withdrawal visit occurs at M13 or M33)  [x]  []  Plasma for Storage     Visits: M1, M2, M3,M4, M5, M6, M7,M8, M9, M11, M13, M17, M21, M25, M29, ED, LTFU Visits  [x]  []  Predose: PK Sampling  Collect < 1 hr before injection Visits: M1, M3, M5, M9, M13, M17, M21, M25, M29  [x]  []  Illicit Drug Screen     Visits: M5, M13, M21, M29, M33  []  [x]  Urinalysis: Morning specimen is preferred     Visits: M13, M33, ED, 4WFU  Time of Collection:   LMP: Patient's last menstrual period was 10/29/2023 (exact date).  []  N/A   Yes No/NA Lab Collection, Cont.:  [x]  []  Urine Pregnancy Test (POCBP only)  Visits: M1, M3, M5, M9, M13, M17, M21, M25, M29, M33 (resulted prior to receipt of IP), ED, 4WFU, LTFU   Time of Collection: 0912    [] NA, assigned Female at birth or Not a POCBP  []                          [x]  Serum Pregnancy Test (POCBP only) only if urine test positive, perform a serum test to confirm.   Yes No/NA Study Drug Administration  []  [x]  CAB LA Administration:     Visit: M1        Needle Size: [] 1.5  [] 2 (For BMI >/= 30)   Body mass index is 26.35 kg/m. Gauge: [] 21G [] 22G [] 23G  Site: [] Left Ventrogluteal  [] Right Ventrogluteal  Time of Administration:    [x]  []  CAB ULA Administration:   Visit: M3, M5, M9, M13, M17, M21, M25,  M29  Sites should be rotated between right and left Needle Size: [x] 1.5  [] 2 (For BMI >/= 30)   Body mass index is 26.35 kg/m.  Gauge: [x] 21G [] 22G [] 23G  Site: [x] Left  Ventrogluteal  [] Right Ventrogluteal  Time of Administration: 1010   [x]  []  ISR Review completed, ISR photography necessary [] Yes[x] No     All Visits *Digital photographs will be documented at all visits (scheduled or unscheduled) on all participants who have an injection site reaction that is a visible, persistent Grade 2 (no improvement/resolution in >10 days), Grade >/=3, or serious. *Examination will include a physician/HCP assessment of pain (or tenderness), pruritus, warmth, infections, rash, erythema (or redness), swelling (or induration), and nodules (granulomas or cysts) *For injection site nodules, the largest diameter will be recorded in the eCRF at each ISR assessment for nodules of at least Grade 1 severity  *Remote Visits: M6+2W, M7+2W, M8+2W, M10, M12, M15, M19, M23, M27, M31 should assess whether participants have any new ISR symptoms/signs since their last visit or have previously reported ISR that has not improved or is worsening, if present site should perform an unscheduled visits ASAP for ISR assessment   Yes No/NA  Pharmacokinetic Sampling  [x]  []   Post Dose PK sampling: 2 hours after injection on dosing days(+/- 30 mins collection window Visits:1 week, M1, M1+1Wk, M2, M3, M3+1Wk, M4, M5, M5+1W, M6, M7, M8, M9, , M9+1Wk, M11, M13, M33, ED, 4WFU, LTFU   Time of Injection: 1010  Time of PK Collection: 1240    Yes No Completed:  [x]  []   HIV/STI  Counseling/Condom Distribution:    All Visits, except remote calls [] Accepted  [x] Declined   Yes No/NA Completed  []  [x]  Was there an ISR during remote phone call visit:  [] Yes [x]  No If yes, schedule unscheduled ISR assessment ASAP Date:   [x]  []  Instructed to contact site if ISR gets worse or does not improve after 10 days from the last ISR assessment.   [x]  []  Assess for Protocol Deviations  [x]  []  Reminders:  Participants of childbearing potential agree to use a highly effective method of contraception consistently and correctly HIV vaccines are not permitted at any time during the study No other experimental agents, antiretroviral drugs, cytotoxic chemotherapy, or radiation therapy my not be administered throughout the trial No systemically administered immunomodulator's permitted Notify study staff if you experience any signs/symptoms, visit provider in ER or scheduled evaluation, or start any new OTC, supplemental or prescription therapies   Yes No Completed:  [x]  []  Compensation:  You will receive a payment of $78.00 at the end of each non dosing visit completed to reimburse you for your time and effort.  You will receive $125.00 at the end of each dosing visit completed to reimburse you for your time and effort.   If you must return to the clinic for an unscheduled visit, you will receive $78.   Amount: $125.00   [x]  []  Schedule Next Visit: Visits should be planned using a calendar months and scheduled based on the date of the Day 1 injection.    Remote Call Visits schedule on M6+2 wk, M7+2wk, M8+2wk, M10, M12, M15, M19, M23, M27, M31 (Assess ISR, AES, Conmeds)  If transitioning to CAB 200mg /ml do not need LTFU visits, but do need ED visit and 4 week followup visit.  Date: 04AUG2025     Time: 0830   Version 2.2 14Jul2025 JLS

## 2023-11-23 ENCOUNTER — Other Ambulatory Visit: Payer: Self-pay

## 2023-11-23 DIAGNOSIS — Z006 Encounter for examination for normal comparison and control in clinical research program: Secondary | ICD-10-CM

## 2023-11-23 NOTE — Research (Addendum)
 ROBERTS [id number]                                                  Protocol Amendment 02  [facility]:  [phone]                                                                  TREATMENT PHASE  Visit: []  Day1+1W     []  M1    []  M1+1W    []  M2    []  M3    []  M3+1W    []  M4    [] M5     [x] M5+1W   [] M6   [] M6+2W   [] M7   [] M7+2W   [] M8   [] M8+2W   [] M9 [] M9+1W   [] M10   [] M11   [] M12   [] M13   [] M15    [] M17   [] M19   [] M21   [] M23   [] M25   [] M27   [] M29   [] M31   [] M33   [] ED  [] 4 wk followup (if not entering LTFU)   [] LTFU M1   [] LTFU M4   [] LTFU M8   [] LTFU M12     Date: 04AUG2025         4 Letter Code: MELO Subject ID: [id number]  Assessments should occur in the following order:  eCSSRs  All other questionnaires  Triplicate 12-lead ECG  Vital signs  Blood draws (clinical laboratory tests, pre-dose pharmacokinetics, etc)  IP injection  Post-dose pharmacokinetics (if required)  Yes No Completed:  [x]  []  Verify participant identity before doing any study procedures   All Visits  [x]  []  Verify correct version of ICF is signed   All Visits   Yes No Questionnaires Completed: Patient Reported Outcome Assessment must be completed before other assessments take place at each designated visit. Completed electronically by participants.  []  [x]  eCSSRs    Visits: M1, M3, M5, M9, M13, M17, M21, M25, M29, M33, ED Any positive  (abnormal) response indicating SIB or any unusual changes in behavior, confirmed by the investigator will result in their discontinuation of study intervention, the PI/SI will arrange for urgent specialist psychiatric evaluation and management  []  [x]  Social Determinants of Health Questionnaire (SDoH)     Visits: M17, ED  []  [x]  Study Medication Satisfaction Questionnaire (SMSQs)     Visits: M1, M3, M5, M9, M13, M17, M21, M25, M29, M33, ED  []  [x]  PrEP Preference and Rationale Questionnaire (only if prior exposure to oral PrEP)     Visits: M9, M17, M25, M33, ED  []  [x]   Acceptability of Injection Questionnaire     Visits: M1, M3, M5, M9, M13, M17, M21, M25, M29, M33, ED  []  [x]  Generalized Anxiety Disorders (GAD-&) Questionnaire     Visits: M1, M3, M5, M9, M13, M17, M21, M25, M29, M33, ED  []  [x]  HIV-risk and PrEP use Anxiety and Stigma Questionnaire prior to the study drug administration     Visits: M17, M29, M33, ED  []  [x]  Sexual Health Practices Questionnaire-administer prior to study drug administration Visits: M1, M3, M5, M9, M13, M17, M21, M25, M29, M33, ED  Yes No Completed:  [x]  []  Review Changes in Medical History    All Visits  [x]  []  Review Changes in Concomitant Medications    All Visits  [x]  []  Assess for AEs, SAEs: All AEs (from Day 1 onwards) and SAEs (from Screening onwards) must be recorded in eCRF at all visits and reported within required timelines.    All Visits  Is participant experiencing any symptomatic STIs currently? [] Yes [x] No    [x]  []  Pull Review in: Medication Review Medication Indication Dose/Route/Frequency Start Date End Date  Magnesium Glycinate Prophylaxis 50mg  PO QD 01Dec2024 31MAR2025  Nicotinamide Riboside Chloride Powder Prophylaxis 1 Capsule QD 01Dec2024 31MAR2025  Resveratrol Prophylaxis 1 Capsule QD 01Dec2024 31MAR2025  Cephalexin Urinary Tract Infection 500 mg tab QD 06Mar2025 11Mar2025  Amoxicillin   Urinary Tract Infection 875 mg tab BID 12Mar2025 22Mar2025  phenazopyridine  (PYRIDIUM )  Urinary Tract Infection 200 MG tablet TID 12Mar2025 17Mar2025  phenazopyridine  (PYRIDIUM )  Urinary Tract Infection 200 MG tablet TID 28mar2025 09APR2025  Ciprofloxacin  (Cipro ) Urinary tract infection 500 mg PO BID 28MAR2025 02APR2025  nitrofurantoin , macrocrystal-monohydrate, (MACROBID )  Urinary Tract Infection 100 mg PO BID  02APR2025 09APR2025  famotidine  (PEPCID )  nausea 20 mg PO BID 29MAR2025   iohexol  (OMNIPAQUE ) 9 MG/ML Oral contrast Abdominal pain  oral solution 500 mL once 29MAR2025 29MAR2025  alum & mag  hydroxide-simeth (MAALOX/MYLANTA)  200-200-20 MG/5ML  nausea Oral suspension 30 mL once 29MAR2025 29MAR2025  lidocaine  (XYLOCAINE ) 2 %  nausea 15 mL PO once 29MAR2025 29MAR2025  lactated ringers  bolus 1,000 mL  lightheadedness 1,000 mL Intravenous once 29MAR2025 29MAR2025  ondansetron  (ZOFRAN )   nausea 4 mg IV once  29MAR2025 29MAR2025  ketorolac  (TORADOL )  Abdominal pain 15 mg Intravenous once 29MAR2025 29MAR2025  iohexol  (OMNIPAQUE ) 300 MG/ML Intravenous Contrast  Abdominal pain  80 mL IV once 29MAR2025 29MAR2025  ibuprofen Right ventrogluteal injection site tenderness 200mg  PO Q8 hours 02JUN2025 09JUN2025   AE Review Diagnosis/Symptoms Indicate: NSAE SAE AESI Maximum Grade Related/Not Related to study intervention CAB LA or CAB ULA Action/Dose Change Start Date (Time, if available) End Date (Time, if available)  Right Ventrogluteal Injection Site Tenderness NSAE  1 Related to CAB LA No Change, No action taken 28Feb2025 02Mar2025  Urinary Tract Infection NSAE 2 Not Related to study intervention No change, no action taken 04Mar2025 22Mar2025  Urinary tract infection NSAE 2 Not related to study intervention No change, no action taken 28MAR2025 09APR2025  Abdominal pain NSAE 2 Not related to study intervention No change, no action taken 29MAR2025 09APR2025  Nausea NSAE 2 Not related to study intervention No change, no action taken 29MAR2025 09APR2025  Vomiting NSAE 2 Not related to study intervention No change, no action taken 29MAR2025 09APR2025  Lightheadedness NSAE 2 Not related to study intervention No change, no action taken 29MAR2025 30MAR2025  Hiatal hernia NSAE 2 Not related to study intervention No change, no action taken 29MAR2025   Uterine leiomyoma, unspecified location  NSAE 2 Not related to study intervention No change, no action taken 29MAR2025   Right ventrogluteal injection site tenderness NSAE 1 Related to CAB ULA No change, no action 02JUN2025 09JUN2025   Medical History  Review  Diagnosis Grade Start Date End Date  Bradycardia, asymptomatic 1 Unk 2025   Irritable Bowel Syndrome 2 Unk 2023   Intermittent Low Back Pain 2 05Aug2022   Conjunctivitis of Left Eye 2 08Jul2023 17Jul2023  Urinary Tract Infection 2 12Jan2024 17Jan2024  Anemia 2 17Jun2024       []  [x]  Contraception Review: Report  in [facility] Visits: M1, M2, M3, M4, M5, M6, M7, M8, M9, M11, M13, M17, M21,M25, M29, M31, M33, ED, 4WFU, LTFU Visits Female of childbearing potential: Must agree to use a highly effective method of contraception consistently and correctly, must also continue to use adequate contraception methods for at least 52 weeks after the last injection. Female condoms must be used in addition to hormonal contraception Sexual Abstinence: considered a highly effective method only if defined as refraining from heterosexual intercourse during the entire period of risk associated with the study intervention.  The reliability of sexual abstinence needs to be evaluated in relation to the duration of the study and the preferred and usual lifestyle of the participant.   Yes No/NA Completed:  []  [x]  Premature cardiovascular disease: female participant <65 years or female participant <55 years in first degree relatives only.  Note changes since the start of the study.     Months 5, 13, 21, 29      [] No Changes    []  [x]  Substance Usage: (list name of substance, amount used, start and stop dates)  Months 5, 55, 59, 85 Social History   Substance and Sexual Activity  Alcohol Use Not Currently   Alcohol/week: 1.0 standard drink of alcohol   Types: 1 Glasses of wine per week    Social History   Substance and Sexual Activity  Drug Use Yes   Types: Marijuana   Comment: + on urine screen    Tobacco Use: Low Risk  (11/16/2023)   Patient History    Smoking Tobacco Use: Never    Smokeless Tobacco Use: Never    Passive Exposure: Never    Caffeine Use:  caffeine containing beverages/day  []  [x]   Triplicate 12-lead ECG     Visit: M1, M3, M5, M9, M29 Perform in a semi-supine position after 5 minutes of rest.  3 individual ECG tracing should be obtained as closely as possible in succession, but no more than 2 minutes apart. The full set of triplicates should be completed in less than 6 minutes. If ECG abnormal clinically significant  then report as an AE/SAE.  Rest Start Time:  ECG #1 start time:     ECG #2:     ECG #3:    []  [x]  Vital Signs:  [] Weight, BMI (after 5 mins in a semi-supine position)    Only on Visits: M1, M3, M5, M9, M13, M17, M21, M25, M29, M33, ED, 4W FU, LTFU Visits  [] Height    Visit: M9  [] BP and Pulse (measured in a semi supine position after 5 minutes rest)     Visit: M1, M3, M5, M9, M13, M17, M21, M25, M29, M33, ED, 4WFU  [] Temperature and RR:    Visit: M13, ED  Rest Start Time:   Vital Signs Start Time:     There is no height or weight on file to calculate BMI.  There were no vitals filed for this visit.   []  [x]  Brief Symptom Directed Physical Examination: Will include at a minimum, assessments of skin, lungs, CV, abdomen (liver and spleen). Any abnormalities must be noted in the CRF (e.g current medical conditions or AE logs).     Visit: M1, M3, M5, M6, M7, M8, M9, M11, M13, M17, M21, M25, M29, M33, ED, 4W FU, LTFU   Physical Exam    Yes No Blood Draw Tracking:  All Visits  [x]  []  Verified previous Hgb to be >= 10 (if value <10, adjust volume per unit SOP)                          []   NA, healthy volunteer, previous Hgb results are unavailable  [x]  []  Does participant report non-study blood collection in the previous 2 months?  [] Yes, estimated vol. mL    [x]  No  [x]  []  Assess Fasting Status.   Visits: M13, M33                                                      Is participant Fasting for at least 6 hours, overnight fast preferred?   [x] No   []  Yes, Date/Time of last meal?   [x]  []  Blood Drawn.     Problems?  [x]  No   [] Yes, why?   Time: 0846    Total  Vol: 2 mL   Yes No Lab Collection:  []  [x]  RAPID HIV TESTING        Visit: M1, M3, M5, M9, M13, M17, M21, M25, M29, M33, ED, 4WFU  [] REACTIVE or [] NON REACTIVE  If positive/reactive participant must be withdrawn from the study even if subsequent confirmatory testing is negative. If reactive participant referred for further testing and clinical management as per local standard of care  Is participant experiencing any signs or symptoms consistent with acute HIV infection  [] Yes [x] No   []  [x]  Chemistry     Visits: M1, M3, M4, M5, M6, M7,M8, M9, M11, M13, M17, M21, M25, M29, M33, ED, 4WFU, LTFU  []  [x]  Hematology     Visits: M1, M3, M4, M5, M6, M7, M8, M9, M11, M13, M17, M21, M25, M29, M33, ED, 4WFU, LTFU  []  [x]  Non rapid HIV immunoassay    Visits: M1, M3, M5, M9, M13, M17, M21, M25, M29, M33, ED, 4WFU LTFU  []  [x]  HIV-1 RNA     Visits: M1, M3, M5, M9, M13, M17, M21, M25, M29, M33, ED, 4WFU, LTFU  []  [x]  Neisseria gonorrhea (GC), Chlamydia trachomatis (CT), Trichomononas vaginalis (TV), syphilis.  If positive we will refer for treatment as per local guidelines.   Visit: M1, M3, M5, M9, M13, M17, M21, M25, M29, M33, ED, 4WFU, LTFU, PRN   **PRN if symptomatic at other visits**  [] GC/CT NAAT urine/vaginal swab  *Rectal and oropharyngeal swab can be collected based on reported sexual history. [] GC and CT: Rectal Swab [] Oropharyngeal Swab [] TV: vaginal Swab  *If female and menstruating may take sample at next visit  []  [x]  Coagulation Tests     Visit: M13, M33  []  [x]  Hepatitis B&C     Visit: M13, M25, ED  []  [x]  Fasting Labs: Glucose     Visit: M13 and M33  (Insulin, HbA1c only collect if withdrawal visit occurs at M13 or M33)  []  [x]  Plasma for Storage     Visits: M1, M2, M3,M4, M5, M6, M7,M8, M9, M11, M13, M17, M21, M25, M29, ED, LTFU Visits  []  [x]  Predose: PK Sampling  Collect < 1 hr before injection Visits: M1, M3, M5, M9, M13, M17, M21, M25, M29  []  [x]  Illicit Drug Screen      Visits: M5, M13, M21, M29, M33  []  [x]  Urinalysis: Morning specimen is preferred     Visits: M13, M33, ED, 4WFU  Time of Collection:   LMP: Patient's last menstrual period was 10/29/2023 (exact date).  []  N/A   Yes No/NA Lab Collection, Cont.:  []  [x]  Urine Pregnancy Test (POCBP only)  Visits: M1, M3, M5, M9, M13,  M17, M21, M25, M29, M33 (resulted prior to receipt of IP), ED, 4WFU, LTFU   Time of Collection:     [] NA, assigned Female at birth or Not a POCBP  []                          [x]  Serum Pregnancy Test (POCBP only) only if urine test positive, perform a serum test to confirm.   Yes No/NA Study Drug Administration  []  [x]  CAB LA Administration:     Visit: M1        Needle Size: [] 1.5  [] 2 (For BMI >/= 30)   There is no height or weight on file to calculate BMI. Gauge: [] 21G [] 22G [] 23G  Site: [] Left Ventrogluteal  [] Right Ventrogluteal  Time of Administration:    []  [x]  CAB ULA Administration:   Visit: M3, M5, M9, M13, M17, M21, M25, M29  Sites should be rotated between right and left Needle Size: [] 1.5  [] 2 (For BMI >/= 30)   There is no height or weight on file to calculate BMI.  Gauge: [] 21G [] 22G [] 23G  Site: [] Left Ventrogluteal  [] Right Ventrogluteal  Time of Administration:    [x]  []  ISR Review completed, ISR photography necessary [] Yes [x] No     All Visits *Digital photographs will be documented at all visits (scheduled or unscheduled) on all participants who have an injection site reaction that is a visible, persistent Grade 2 (no improvement/resolution in >10 days), Grade >/=3, or serious. *Examination will include a physician/HCP assessment of pain (or tenderness), pruritus, warmth, infections, rash, erythema (or redness), swelling (or induration), and nodules (granulomas or cysts) *For injection site nodules, the largest diameter will be recorded in the eCRF at each ISR assessment for nodules of at least Grade 1 severity  *Remote Visits: M6+2W, M7+2W, M8+2W, M10,  M12, M15, M19, M23, M27, M31 should assess whether participants have any new ISR symptoms/signs since their last visit or have previously reported ISR that has not improved or is worsening, if present site should perform an unscheduled visits ASAP for ISR assessment   Yes No/NA  Pharmacokinetic Sampling  [x]  []   Post Dose PK sampling: 2 hours after injection on dosing days(+/- 30 mins collection window Visits:1 week, M1, M1+1Wk, M2, M3, M3+1Wk, M4, M5, M5+1W, M6, M7, M8, M9, , M9+1Wk, M11, M13, M33, ED, 4WFU, LTFU    Time of PK Collection: 0846    Yes No Completed:  [x]  []   HIV/STI  Counseling/Condom Distribution:    All Visits, except remote calls [] Accepted  [x] Declined   Yes No/NA Completed  []  [x]  Was there an ISR during remote phone call visit:  [] Yes [x]  No If yes, schedule unscheduled ISR assessment ASAP Date:   [x]  []  Instructed to contact site if ISR gets worse or does not improve after 10 days from the last ISR assessment.  [x]  []  Assess for Protocol Deviations  [x]  []  Reminders:  Participants of childbearing potential agree to use a highly effective method of contraception consistently and correctly HIV vaccines are not permitted at any time during the study No other experimental agents, antiretroviral drugs, cytotoxic chemotherapy, or radiation therapy my not be administered throughout the trial No systemically administered immunomodulator's permitted Notify study staff if you experience any signs/symptoms, visit provider in ER or scheduled evaluation, or start any new OTC, supplemental or prescription therapies   Yes No Completed:  [x]  []  Compensation:  You will receive a payment of $78.00 at the end  of each non dosing visit completed to reimburse you for your time and effort.  You will receive $125.00 at the end of each dosing visit completed to reimburse you for your time and effort.   If you must return to the clinic for an unscheduled visit, you will receive $78.    Amount: $78.00   [x]  []  Schedule Next Visit: Visits should be planned using a calendar months and scheduled based on the date of the Day 1 injection.    Remote Call Visits schedule on M6+2 wk, M7+2wk, M8+2wk, M10, M12, M15, M19, M23, M27, M31 (Assess ISR, AES, Conmeds)  If transitioning to CAB 200mg /ml do not need LTFU visits, but do need ED visit and 4 week followup visit.  Date: 29SEP2025     Time: 0830   Version 2.2 14Jul2025 JLS

## 2023-11-25 NOTE — Research (Signed)
 SABRA

## 2023-12-15 ENCOUNTER — Other Ambulatory Visit: Payer: Self-pay

## 2023-12-15 DIAGNOSIS — Z006 Encounter for examination for normal comparison and control in clinical research program: Secondary | ICD-10-CM

## 2023-12-15 NOTE — Research (Addendum)
 ROBERTS [id number]                                                  Protocol Amendment 02  [facility] Research Site:  [phone]                                                                  TREATMENT PHASE  Visit: []  Day1+1W     []  M1    []  M1+1W    []  M2    []  M3    []  M3+1W    []  M4    [] M5     [] M5+1W   [x] M6   [] M6+2W   [] M7   [] M7+2W   [] M8   [] M8+2W   [] M9 [] M9+1W   [] M10   [] M11   [] M12   [] M13   [] M15    [] M17   [] M19   [] M21   [] M23   [] M25   [] M27   [] M29   [] M31   [] M33   [] ED  [] 4 wk followup (if not entering LTFU)   [] LTFU M1   [] LTFU M4   [] LTFU M8   [] LTFU M12     Date: 26AUG2025         4 Letter Code: MELO Subject ID: [id number]  Assessments should occur in the following order:  eCSSRs  All other questionnaires  Triplicate 12-lead ECG  Vital signs  Blood draws (clinical laboratory tests, pre-dose pharmacokinetics, etc)  IP injection  Post-dose pharmacokinetics (if required)  Yes No Completed:  [x]  []  Verify participant identity before doing any study procedures   All Visits  [x]  []  Verify correct version of ICF is signed   All Visits   Yes No Questionnaires Completed: Patient Reported Outcome Assessment must be completed before other assessments take place at each designated visit. Completed electronically by participants.  []  [x]  eCSSRs    Visits: M1, M3, M5, M9, M13, M17, M21, M25, M29, M33, ED Any positive  (abnormal) response indicating SIB or any unusual changes in behavior, confirmed by the investigator will result in their discontinuation of study intervention, the PI/SI will arrange for urgent specialist psychiatric evaluation and management  []  [x]  Social Determinants of Health Questionnaire (SDoH)     Visits: M17, ED  []  [x]  Study Medication Satisfaction Questionnaire (SMSQs)     Visits: M1, M3, M5, M9, M13, M17, M21, M25, M29, M33, ED  []  [x]  PrEP Preference and Rationale Questionnaire (only if prior exposure to oral PrEP)     Visits: M9, M17, M25, M33, ED  []  [x]   Acceptability of Injection Questionnaire     Visits: M1, M3, M5, M9, M13, M17, M21, M25, M29, M33, ED  []  [x]  Generalized Anxiety Disorders (GAD-&) Questionnaire     Visits: M1, M3, M5, M9, M13, M17, M21, M25, M29, M33, ED  []  [x]  HIV-risk and PrEP use Anxiety and Stigma Questionnaire prior to the study drug administration     Visits: M17, M29, M33, ED  []  [x]  Sexual Health Practices Questionnaire-administer prior to study drug administration Visits: M1, M3, M5, M9, M13, M17, M21, M25, M29, M33, ED  Yes No Completed:  [x]  []  Review Changes in Medical History    All Visits  [x]  []  Review Changes in Concomitant Medications    All Visits  [x]  []  Assess for AEs, SAEs: All AEs (from Day 1 onwards) and SAEs (from Screening onwards) must be recorded in eCRF at all visits and reported within required timelines.    All Visits  Is participant experiencing any symptomatic STIs currently? [] Yes [x] No    [x]  []  Pull Review in: Medication Review Medication Indication Dose/Route/Frequency Start Date End Date  Magnesium Glycinate Prophylaxis 50mg  PO QD 01Dec2024 31MAR2025  Nicotinamide Riboside Chloride Powder Prophylaxis 1 Capsule QD 01Dec2024 31MAR2025  Resveratrol Prophylaxis 1 Capsule QD 01Dec2024 31MAR2025  Cephalexin Urinary Tract Infection 500 mg tab QD 06Mar2025 11Mar2025  Amoxicillin   Urinary Tract Infection 875 mg tab BID 12Mar2025 22Mar2025  phenazopyridine  (PYRIDIUM )  Urinary Tract Infection 200 MG tablet TID 12Mar2025 17Mar2025  phenazopyridine  (PYRIDIUM )  Urinary Tract Infection 200 MG tablet TID 28mar2025 09APR2025  Ciprofloxacin  (Cipro ) Urinary tract infection 500 mg PO BID 28MAR2025 02APR2025  nitrofurantoin , macrocrystal-monohydrate, (MACROBID )  Urinary Tract Infection 100 mg PO BID  02APR2025 09APR2025  famotidine  (PEPCID )  nausea 20 mg PO BID 29MAR2025   iohexol  (OMNIPAQUE ) 9 MG/ML Oral contrast Abdominal pain  oral solution 500 mL once 29MAR2025 29MAR2025  alum & mag  hydroxide-simeth (MAALOX/MYLANTA)  200-200-20 MG/5ML  nausea Oral suspension 30 mL once 29MAR2025 29MAR2025  lidocaine  (XYLOCAINE ) 2 %  nausea 15 mL PO once 29MAR2025 29MAR2025  lactated ringers  bolus 1,000 mL  lightheadedness 1,000 mL Intravenous once 29MAR2025 29MAR2025  ondansetron  (ZOFRAN )   nausea 4 mg IV once  29MAR2025 29MAR2025  ketorolac  (TORADOL )  Abdominal pain 15 mg Intravenous once 29MAR2025 29MAR2025  iohexol  (OMNIPAQUE ) 300 MG/ML Intravenous Contrast  Abdominal pain  80 mL IV once 29MAR2025 29MAR2025  ibuprofen Right ventrogluteal injection site tenderness 200mg  PO Q8 hours 02JUN2025 09JUN2025   AE Review Diagnosis/Symptoms Indicate: NSAE SAE AESI Maximum Grade Related/Not Related to study intervention CAB LA or CAB ULA Action/Dose Change Start Date (Time, if available) End Date (Time, if available)  Right Ventrogluteal Injection Site Tenderness NSAE  1 Related to CAB LA No Change, No action taken 28Feb2025 02Mar2025  Urinary Tract Infection NSAE 2 Not Related to study intervention No change, no action taken 04Mar2025 22Mar2025  Urinary tract infection NSAE 2 Not related to study intervention No change, no action taken 28MAR2025 09APR2025  Abdominal pain NSAE 2 Not related to study intervention No change, no action taken 29MAR2025 09APR2025  Nausea NSAE 2 Not related to study intervention No change, no action taken 29MAR2025 09APR2025  Vomiting NSAE 2 Not related to study intervention No change, no action taken 29MAR2025 09APR2025  Lightheadedness NSAE 2 Not related to study intervention No change, no action taken 29MAR2025 30MAR2025  Hiatal hernia NSAE 2 Not related to study intervention No change, no action taken 29MAR2025   Uterine leiomyoma, unspecified location  NSAE 2 Not related to study intervention No change, no action taken 29MAR2025   Right ventrogluteal injection site tenderness NSAE 1 Related to CAB ULA No change, no action 02JUN2025 09JUN2025   Medical History  Review  Diagnosis Grade Start Date End Date  Bradycardia, asymptomatic 1 Unk 2025   Irritable Bowel Syndrome 2 Unk 2023   Intermittent Low Back Pain 2 05Aug2022   Conjunctivitis of Left Eye 2 08Jul2023 17Jul2023  Urinary Tract Infection 2 12Jan2024 17Jan2024  Anemia 2 17Jun2024       [x]  []  Contraception Review: Report  in [facility] Visits: M1, M2, M3, M4, M5, M6, M7, M8, M9, M11, M13, M17, M21,M25, M29, M31, M33, ED, 4WFU, LTFU Visits Female of childbearing potential: Must agree to use a highly effective method of contraception consistently and correctly, must also continue to use adequate contraception methods for at least 52 weeks after the last injection. Female condoms must be used in addition to hormonal contraception Sexual Abstinence: considered a highly effective method only if defined as refraining from heterosexual intercourse during the entire period of risk associated with the study intervention.  The reliability of sexual abstinence needs to be evaluated in relation to the duration of the study and the preferred and usual lifestyle of the participant.   Yes No/NA Completed:  []  [x]  Premature cardiovascular disease: female participant <65 years or female participant <55 years in first degree relatives only.  Note changes since the start of the study.     Months 5, 13, 21, 29      [] No Changes    []  [x]  Substance Usage: (list name of substance, amount used, start and stop dates)  Months 5, 19, 9, 60 Social History   Substance and Sexual Activity  Alcohol Use Not Currently   Alcohol/week: 1.0 standard drink of alcohol   Types: 1 Glasses of wine per week    Social History   Substance and Sexual Activity  Drug Use Yes   Types: Marijuana   Comment: + on urine screen    Tobacco Use: Low Risk  (11/16/2023)   Patient History    Smoking Tobacco Use: Never    Smokeless Tobacco Use: Never    Passive Exposure: Never    Caffeine Use:  caffeine containing beverages/day  []  [x]   Triplicate 12-lead ECG     Visit: M1, M3, M5, M9, M29 Perform in a semi-supine position after 5 minutes of rest.  3 individual ECG tracing should be obtained as closely as possible in succession, but no more than 2 minutes apart. The full set of triplicates should be completed in less than 6 minutes. If ECG abnormal clinically significant  then report as an AE/SAE.  Rest Start Time:  ECG #1 start time:     ECG #2:     ECG #3:    []  [x]  Vital Signs:  [] Weight, BMI (after 5 mins in a semi-supine position)    Only on Visits: M1, M3, M5, M9, M13, M17, M21, M25, M29, M33, ED, 4W FU, LTFU Visits  [] Height    Visit: M9  [] BP and Pulse (measured in a semi supine position after 5 minutes rest)     Visit: M1, M3, M5, M9, M13, M17, M21, M25, M29, M33, ED, 4WFU  [] Temperature and RR:    Visit: M13, ED  Rest Start Time:   Vital Signs Start Time:     There is no height or weight on file to calculate BMI.  There were no vitals filed for this visit.   [x]  []  Brief Symptom Directed Physical Examination: Will include at a minimum, assessments of skin, lungs, CV, abdomen (liver and spleen). Any abnormalities must be noted in the CRF (e.g current medical conditions or AE logs).     Visit: M1, M3, M5, M6, M7, M8, M9, M11, M13, M17, M21, M25, M29, M33, ED, 4W FU, LTFU   Physical Exam Constitutional:      Appearance: Normal appearance. She is normal weight.  Cardiovascular:     Rate and Rhythm: Normal rate and regular rhythm.  Pulmonary:  Effort: Pulmonary effort is normal.     Breath sounds: Normal breath sounds.  Abdominal:     General: Bowel sounds are normal. There is no distension.     Palpations: Abdomen is soft.     Tenderness: There is no abdominal tenderness.  Skin:    General: Skin is warm and dry.  Neurological:     General: No focal deficit present.     Mental Status: She is alert and oriented to person, place, and time. Mental status is at baseline.  Psychiatric:        Mood and  Affect: Mood normal.        Behavior: Behavior normal.        Thought Content: Thought content normal.        Judgment: Judgment normal.       Yes No Blood Draw Tracking:  All Visits  [x]  []  Verified previous Hgb to be >= 10 (if value <10, adjust volume per unit SOP)                          [] NA, healthy volunteer, previous Hgb results are unavailable  [x]  []  Does participant report non-study blood collection in the previous 2 months?  [] Yes, estimated vol. mL    [x]  No  []  [x]  Assess Fasting Status.   Visits: M13, M33                                                      Is participant Fasting for at least 6 hours, overnight fast preferred?  []  No   []  Yes, Date/Time of last meal?   [x]  []  Blood Drawn.     Problems?  [x]  No   [] Yes, why?   Time: 0855    Total Vol: 14 mL   Yes No Lab Collection:  []  [x]  RAPID HIV TESTING        Visit: M1, M3, M5, M9, M13, M17, M21, M25, M29, M33, ED, 4WFU  [] REACTIVE or [] NON REACTIVE  If positive/reactive participant must be withdrawn from the study even if subsequent confirmatory testing is negative. If reactive participant referred for further testing and clinical management as per local standard of care  Is participant experiencing any signs or symptoms consistent with acute HIV infection  [] Yes [x] No   [x]  []  Chemistry     Visits: M1, M3, M4, M5, M6, M7,M8, M9, M11, M13, M17, M21, M25, M29, M33, ED, 4WFU, LTFU  [x]  []  Hematology     Visits: M1, M3, M4, M5, M6, M7, M8, M9, M11, M13, M17, M21, M25, M29, M33, ED, 4WFU, LTFU  []  [x]  Non rapid HIV immunoassay    Visits: M1, M3, M5, M9, M13, M17, M21, M25, M29, M33, ED, 4WFU LTFU  []  [x]  HIV-1 RNA     Visits: M1, M3, M5, M9, M13, M17, M21, M25, M29, M33, ED, 4WFU, LTFU  []  [x]  Neisseria gonorrhea (GC), Chlamydia trachomatis (CT), Trichomononas vaginalis (TV), syphilis.  If positive we will refer for treatment as per local guidelines.   Visit: M1, M3, M5, M9, M13, M17, M21, M25, M29, M33, ED, 4WFU,  LTFU, PRN   **PRN if symptomatic at other visits**  [] GC/CT NAAT urine/vaginal swab  *Rectal and oropharyngeal swab can be collected based on reported sexual history. [] GC and CT:  Rectal Swab [] Oropharyngeal Swab [] TV: vaginal Swab  *If female and menstruating may take sample at next visit  []  [x]  Coagulation Tests     Visit: M13, M33  []  [x]  Hepatitis B&C     Visit: M13, M25, ED  []  [x]  Fasting Labs: Glucose     Visit: M13 and M33  (Insulin, HbA1c only collect if withdrawal visit occurs at M13 or M33)  [x]  []  Plasma for Storage     Visits: M1, M2, M3,M4, M5, M6, M7,M8, M9, M11, M13, M17, M21, M25, M29, ED, LTFU Visits  []  [x]  Predose: PK Sampling  Collect < 1 hr before injection Visits: M1, M3, M5, M9, M13, M17, M21, M25, M29  []  [x]  Illicit Drug Screen     Visits: M5, M13, M21, M29, M33  []  [x]  Urinalysis: Morning specimen is preferred     Visits: M13, M33, ED, 4WFU  Time of Collection:   LMP: Patient's last menstrual period was 10/29/2023 (exact date).  []  N/A   Yes No/NA Lab Collection, Cont.:  []  [x]  Urine Pregnancy Test (POCBP only)  Visits: M1, M3, M5, M9, M13, M17, M21, M25, M29, M33 (resulted prior to receipt of IP), ED, 4WFU, LTFU   Time of Collection:     [] NA, assigned Female at birth or Not a POCBP  []                          [x]  Serum Pregnancy Test (POCBP only) only if urine test positive, perform a serum test to confirm.   Yes No/NA Study Drug Administration  []  [x]  CAB LA Administration:     Visit: M1        Needle Size: [] 1.5  [] 2 (For BMI >/= 30)   There is no height or weight on file to calculate BMI. Gauge: [] 21G [] 22G [] 23G  Site: [] Left Ventrogluteal  [] Right Ventrogluteal  Time of Administration:    []  [x]  CAB ULA Administration:   Visit: M3, M5, M9, M13, M17, M21, M25, M29  Sites should be rotated between right and left Needle Size: [] 1.5  [] 2 (For BMI >/= 30)   There is no height or weight on file to calculate BMI.  Gauge: [] 21G [] 22G [] 23G  Site:  [] Left Ventrogluteal  [] Right Ventrogluteal  Time of Administration:    [x]  []  ISR Review completed, ISR photography necessary [] Yes[x] No     All Visits *Digital photographs will be documented at all visits (scheduled or unscheduled) on all participants who have an injection site reaction that is a visible, persistent Grade 2 (no improvement/resolution in >10 days), Grade >/=3, or serious. *Examination will include a physician/HCP assessment of pain (or tenderness), pruritus, warmth, infections, rash, erythema (or redness), swelling (or induration), and nodules (granulomas or cysts) *For injection site nodules, the largest diameter will be recorded in the eCRF at each ISR assessment for nodules of at least Grade 1 severity  *Remote Visits: M6+2W, M7+2W, M8+2W, M10, M12, M15, M19, M23, M27, M31 should assess whether participants have any new ISR symptoms/signs since their last visit or have previously reported ISR that has not improved or is worsening, if present site should perform an unscheduled visits ASAP for ISR assessment   Yes No/NA  Pharmacokinetic Sampling  [x]  []   Post Dose PK sampling: 2 hours after injection on dosing days(+/- 30 mins collection window Visits:1 week, M1, M1+1Wk, M2, M3, M3+1Wk, M4, M5, M5+1W, M6, M7, M8, M9, , M9+1Wk, M11, M13, M33, ED, 4WFU, LTFU  Time of PK Collection: 0855    Yes No Completed:  [x]  []   HIV/STI  Counseling/Condom Distribution:    All Visits, except remote calls [] Accepted  [x] Declined   Yes No/NA Completed  []  [x]  Was there an ISR during remote phone call visit:  [] Yes [x]  No If yes, schedule unscheduled ISR assessment ASAP Date:   [x]  []  Instructed to contact site if ISR gets worse or does not improve after 10 days from the last ISR assessment.  [x]  []  Assess for Protocol Deviations  [x]  []  Reminders:  Participants of childbearing potential agree to use a highly effective method of contraception consistently and correctly HIV vaccines  are not permitted at any time during the study No other experimental agents, antiretroviral drugs, cytotoxic chemotherapy, or radiation therapy my not be administered throughout the trial No systemically administered immunomodulator's permitted Notify study staff if you experience any signs/symptoms, visit provider in ER or scheduled evaluation, or start any new OTC, supplemental or prescription therapies   Yes No Completed:  [x]  []  Compensation:  You will receive a payment of $78.00 at the end of each non dosing visit completed to reimburse you for your time and effort.  You will receive $125.00 at the end of each dosing visit completed to reimburse you for your time and effort.   If you must return to the clinic for an unscheduled visit, you will receive $78.   Amount: $78.00   [x]  []  Schedule Next Visit: Visits should be planned using a calendar months and scheduled based on the date of the Day 1 injection.    Remote Call Visits schedule on M6+2 wk, M7+2wk, M8+2wk, M10, M12, M15, M19, M23, M27, M31 (Assess ISR, AES, Conmeds)  If transitioning to CAB 200mg /ml do not need LTFU visits, but do need ED visit and 4 week followup visit.  Date: 09SEP2025     Time: 0900 PHONE VISIT   Version 2.2 14Jul2025 JLS

## 2023-12-25 NOTE — Progress Notes (Signed)
 I have reviewed this participant's visit findings and labs for EXTEND study.

## 2023-12-29 ENCOUNTER — Other Ambulatory Visit: Payer: Self-pay

## 2023-12-29 DIAGNOSIS — Z006 Encounter for examination for normal comparison and control in clinical research program: Secondary | ICD-10-CM

## 2023-12-29 NOTE — Research (Cosign Needed Addendum)
 ROBERTS [id number]                                                  Protocol Amendment 02  [facility]:  [phone]                                                                  TREATMENT PHASE  Visit: []  Day1+1W     []  M1    []  M1+1W    []  M2    []  M3    []  M3+1W    []  M4    [] M5     [] M5+1W   [] M6   [x] M6+2W   [] M7   [] M7+2W   [] M8   [] M8+2W   [] M9 [] M9+1W   [] M10   [] M11   [] M12   [] M13   [] M15    [] M17   [] M19   [] M21   [] M23   [] M25   [] M27   [] M29   [] M31   [] M33   [] ED  [] 4 wk followup (if not entering LTFU)   [] LTFU M1   [] LTFU M4   [] LTFU M8   [] LTFU M12     Date: 08SEP2025         4 Letter Code: MELO Subject ID: [id number]  Assessments should occur in the following order:  eCSSRs  All other questionnaires  Triplicate 12-lead ECG  Vital signs  Blood draws (clinical laboratory tests, pre-dose pharmacokinetics, etc)  IP injection  Post-dose pharmacokinetics (if required)  Yes No Completed:  [x]  []  Verify participant identity before doing any study procedures   All Visits  [x]  []  Verify correct version of ICF is signed   All Visits   Yes No Questionnaires Completed: Patient Reported Outcome Assessment must be completed before other assessments take place at each designated visit. Completed electronically by participants.  []  [x]  eCSSRs    Visits: M1, M3, M5, M9, M13, M17, M21, M25, M29, M33, ED Any positive  (abnormal) response indicating SIB or any unusual changes in behavior, confirmed by the investigator will result in their discontinuation of study intervention, the PI/SI will arrange for urgent specialist psychiatric evaluation and management  []  [x]  Social Determinants of Health Questionnaire (SDoH)     Visits: M17, ED  []  [x]  Study Medication Satisfaction Questionnaire (SMSQs)     Visits: M1, M3, M5, M9, M13, M17, M21, M25, M29, M33, ED  []  [x]  PrEP Preference and Rationale Questionnaire (only if prior exposure to oral PrEP)     Visits: M9, M17, M25, M33, ED  []  [x]   Acceptability of Injection Questionnaire     Visits: M1, M3, M5, M9, M13, M17, M21, M25, M29, M33, ED  []  [x]  Generalized Anxiety Disorders (GAD-&) Questionnaire     Visits: M1, M3, M5, M9, M13, M17, M21, M25, M29, M33, ED  []  [x]  HIV-risk and PrEP use Anxiety and Stigma Questionnaire prior to the study drug administration     Visits: M17, M29, M33, ED  []  [x]  Sexual Health Practices Questionnaire-administer prior to study drug administration Visits: M1, M3, M5, M9, M13, M17, M21, M25, M29, M33, ED  Yes No Completed:  [x]  []  Review Changes in Medical History    All Visits  [x]  []  Review Changes in Concomitant Medications    All Visits  [x]  []  Assess for AEs, SAEs: All AEs (from Day 1 onwards) and SAEs (from Screening onwards) must be recorded in eCRF at all visits and reported within required timelines.    All Visits  Is participant experiencing any symptomatic STIs currently? [] Yes [x] No    [x]  []  Pull Review in: Medication Review Medication Review Medication Indication Dose/Route/Frequency Start Date End Date  Magnesium Glycinate Prophylaxis 50mg  PO QD 01Dec2024 31MAR2025  Nicotinamide Riboside Chloride Powder Prophylaxis 1 Capsule QD 01Dec2024 31MAR2025  Resveratrol Prophylaxis 1 Capsule QD 01Dec2024 31MAR2025  Cephalexin Urinary Tract Infection 500 mg tab QD 06Mar2025 11Mar2025  Amoxicillin   Urinary Tract Infection 875 mg tab BID 12Mar2025 22Mar2025  phenazopyridine  (PYRIDIUM )  Urinary Tract Infection 200 MG tablet TID 12Mar2025 17Mar2025  phenazopyridine  (PYRIDIUM )  Urinary Tract Infection 200 MG tablet TID 28mar2025 09APR2025  Ciprofloxacin  (Cipro ) Urinary tract infection 500 mg PO BID 28MAR2025 02APR2025  nitrofurantoin , macrocrystal-monohydrate, (MACROBID )  Urinary Tract Infection 100 mg PO BID  02APR2025 09APR2025  famotidine  (PEPCID )  nausea 20 mg PO BID 29MAR2025   iohexol  (OMNIPAQUE ) 9 MG/ML Oral contrast Abdominal pain  oral solution 500 mL once 29MAR2025 29MAR2025  alum &  mag hydroxide-simeth (MAALOX/MYLANTA)  200-200-20 MG/5ML  nausea Oral suspension 30 mL once 29MAR2025 29MAR2025  lidocaine  (XYLOCAINE ) 2 %  nausea 15 mL PO once 29MAR2025 29MAR2025  lactated ringers  bolus 1,000 mL  lightheadedness 1,000 mL Intravenous once 29MAR2025 29MAR2025  ondansetron  (ZOFRAN )   nausea 4 mg IV once  29MAR2025 29MAR2025  ketorolac  (TORADOL )  Abdominal pain 15 mg Intravenous once 29MAR2025 29MAR2025  iohexol  (OMNIPAQUE ) 300 MG/ML Intravenous Contrast  Abdominal pain  80 mL IV once 29MAR2025 29MAR2025  ibuprofen Right ventrogluteal injection site tenderness 200mg  PO Q8 hours 02JUN2025 09JUN2025  metroNIDAZOLE (FLAGYL)  diarrhea 500mg  PO TID 04SEP2025   Iron anemia 65mg  PO QD 04SEP2025   Vitamin C anemia 125mg  PO QD 04SEP2025    AE Review Diagnosis/Symptoms Indicate: NSAE SAE AESI Maximum Grade Related/Not Related to study intervention CAB LA or CAB ULA Action/Dose Change Start Date (Time, if available) End Date (Time, if available)  Right Ventrogluteal Injection Site Tenderness NSAE  1 Related to CAB LA No Change, No action taken 28Feb2025 02Mar2025  Urinary Tract Infection NSAE 2 Not Related to study intervention No change, no action taken 04Mar2025 22Mar2025  Urinary tract infection NSAE 2 Not related to study intervention No change, no action taken 28MAR2025 09APR2025  Abdominal pain NSAE 2 Not related to study intervention No change, no action taken 29MAR2025 09APR2025  Nausea NSAE 2 Not related to study intervention No change, no action taken 29MAR2025 09APR2025  Vomiting NSAE 2 Not related to study intervention No change, no action taken 29MAR2025 09APR2025  Lightheadedness NSAE 2 Not related to study intervention No change, no action taken 29MAR2025 30MAR2025  Hiatal hernia NSAE 2 Not related to study intervention No change, no action taken 29MAR2025   Uterine leiomyoma, unspecified location  NSAE 2 Not related to study intervention No change, no action taken  29MAR2025   Right ventrogluteal injection site tenderness NSAE 1 Related to CAB ULA No change, no action 02JUN2025 09JUN2025  Viral Diarrhea NSAE 2 Not related to study intervention No change, no action 01SEP2025 05SEP2025   Medical History Review  Diagnosis Grade Start Date End Date  Bradycardia, asymptomatic 1 Unk 2025  Irritable Bowel Syndrome 2 Unk 2023   Intermittent Low Back Pain 2 05Aug2022   Conjunctivitis of Left Eye 2 08Jul2023 17Jul2023  Urinary Tract Infection 2 12Jan2024 17Jan2024  Anemia 2 17Jun2024       []  [x]  Contraception Review: Report in [facility] Visits: M1, M2, M3, M4, M5, M6, M7, M8, M9, M11, M13, M17, M21,M25, M29, M31, M33, ED, 4WFU, LTFU Visits Female of childbearing potential: Must agree to use a highly effective method of contraception consistently and correctly, must also continue to use adequate contraception methods for at least 52 weeks after the last injection. Female condoms must be used in addition to hormonal contraception Sexual Abstinence: considered a highly effective method only if defined as refraining from heterosexual intercourse during the entire period of risk associated with the study intervention.  The reliability of sexual abstinence needs to be evaluated in relation to the duration of the study and the preferred and usual lifestyle of the participant.   Yes No/NA Completed:  []  [x]  Premature cardiovascular disease: female participant <65 years or female participant <55 years in first degree relatives only.  Note changes since the start of the study.     Months 5, 13, 21, 29      [] No Changes    []  [x]  Substance Usage: (list name of substance, amount used, start and stop dates)  Months 5, 97, 44, 23 Social History   Substance and Sexual Activity  Alcohol Use Not Currently   Alcohol/week: 1.0 standard drink of alcohol   Types: 1 Glasses of wine per week    Social History   Substance and Sexual Activity  Drug Use Yes   Types: Marijuana    Comment: + on urine screen    Tobacco Use: Low Risk  (12/24/2023)   Received from [facility]   Patient History    Smoking Tobacco Use: Never    Smokeless Tobacco Use: Never    Passive Exposure: Not on file    Caffeine Use:  caffeine containing beverages/day  []  [x]  Triplicate 12-lead ECG     Visit: M1, M3, M5, M9, M29 Perform in a semi-supine position after 5 minutes of rest.  3 individual ECG tracing should be obtained as closely as possible in succession, but no more than 2 minutes apart. The full set of triplicates should be completed in less than 6 minutes. If ECG abnormal clinically significant  then report as an AE/SAE.  Rest Start Time:  ECG #1 start time:     ECG #2:     ECG #3:    []  [x]  Vital Signs:  [] Weight, BMI (after 5 mins in a semi-supine position)    Only on Visits: M1, M3, M5, M9, M13, M17, M21, M25, M29, M33, ED, 4W FU, LTFU Visits  [] Height    Visit: M9  [] BP and Pulse (measured in a semi supine position after 5 minutes rest)     Visit: M1, M3, M5, M9, M13, M17, M21, M25, M29, M33, ED, 4WFU  [] Temperature and RR:    Visit: M13, ED  Rest Start Time:   Vital Signs Start Time:     There is no height or weight on file to calculate BMI.  There were no vitals filed for this visit.   []  [x]  Brief Symptom Directed Physical Examination: Will include at a minimum, assessments of skin, lungs, CV, abdomen (liver and spleen). Any abnormalities must be noted in the CRF (e.g current medical conditions or AE logs).     Visit:  M1, M3, M5, M6, M7, M8, M9, M11, M13, M17, M21, M25, M29, M33, ED, 4W FU, LTFU   Physical Exam    Yes No Blood Draw Tracking:  All Visits  []  [x]  Verified previous Hgb to be >= 10 (if value <10, adjust volume per unit SOP)                          [] NA, healthy volunteer, previous Hgb results are unavailable  []  [x]  Does participant report non-study blood collection in the previous 2 months?  [] Yes, estimated vol. mL    []  No  []  [x]  Assess Fasting  Status.   Visits: M13, M33                                                      Is participant Fasting for at least 6 hours, overnight fast preferred?  [x]  No   []  Yes, Date/Time of last meal?   []  [x]  Blood Drawn.     Problems?  []  No   [] Yes, why?   Time:     Total Vol:  mL   Yes No Lab Collection:  []  [x]  RAPID HIV TESTING        Visit: M1, M3, M5, M9, M13, M17, M21, M25, M29, M33, ED, 4WFU  [] REACTIVE or [] NON REACTIVE  If positive/reactive participant must be withdrawn from the study even if subsequent confirmatory testing is negative. If reactive participant referred for further testing and clinical management as per local standard of care  Is participant experiencing any signs or symptoms consistent with acute HIV infection  [] Yes [x] No   []  [x]  Chemistry     Visits: M1, M3, M4, M5, M6, M7,M8, M9, M11, M13, M17, M21, M25, M29, M33, ED, 4WFU, LTFU  []  [x]  Hematology     Visits: M1, M3, M4, M5, M6, M7, M8, M9, M11, M13, M17, M21, M25, M29, M33, ED, 4WFU, LTFU  []  [x]  Non rapid HIV immunoassay    Visits: M1, M3, M5, M9, M13, M17, M21, M25, M29, M33, ED, 4WFU LTFU  []  [x]  HIV-1 RNA     Visits: M1, M3, M5, M9, M13, M17, M21, M25, M29, M33, ED, 4WFU, LTFU  []  [x]  Neisseria gonorrhea (GC), Chlamydia trachomatis (CT), Trichomononas vaginalis (TV), syphilis.  If positive we will refer for treatment as per local guidelines.   Visit: M1, M3, M5, M9, M13, M17, M21, M25, M29, M33, ED, 4WFU, LTFU, PRN   **PRN if symptomatic at other visits**  [] GC/CT NAAT urine/vaginal swab  *Rectal and oropharyngeal swab can be collected based on reported sexual history. [] GC and CT: Rectal Swab [] Oropharyngeal Swab [] TV: vaginal Swab  *If female and menstruating may take sample at next visit  []  [x]  Coagulation Tests     Visit: M13, M33  []  [x]  Hepatitis B&C     Visit: M13, M25, ED  []  [x]  Fasting Labs: Glucose     Visit: M13 and M33  (Insulin, HbA1c only collect if withdrawal visit occurs at M13 or  M33)  []  [x]  Plasma for Storage     Visits: M1, M2, M3,M4, M5, M6, M7,M8, M9, M11, M13, M17, M21, M25, M29, ED, LTFU Visits  []  [x]  Predose: PK Sampling  Collect < 1 hr before injection Visits: M1, M3, M5, M9, M13, M17,  M21, M25, M29  []  [x]  Illicit Drug Screen     Visits: M5, M13, M21, M29, M33  []  [x]  Urinalysis: Morning specimen is preferred     Visits: M13, M33, ED, 4WFU  Time of Collection:   LMP: No LMP recorded.  []  N/A   Yes No/NA Lab Collection, Cont.:  []  [x]  Urine Pregnancy Test (POCBP only)  Visits: M1, M3, M5, M9, M13, M17, M21, M25, M29, M33 (resulted prior to receipt of IP), ED, 4WFU, LTFU   Time of Collection:     [] NA, assigned Female at birth or Not a POCBP  []                          [x]  Serum Pregnancy Test (POCBP only) only if urine test positive, perform a serum test to confirm.   Yes No/NA Study Drug Administration  []  [x]  CAB LA Administration:     Visit: M1        Needle Size: [] 1.5  [] 2 (For BMI >/= 30)   There is no height or weight on file to calculate BMI. Gauge: [] 21G [] 22G [] 23G  Site: [] Left Ventrogluteal  [] Right Ventrogluteal  Time of Administration:    []  [x]  CAB ULA Administration:   Visit: M3, M5, M9, M13, M17, M21, M25, M29  Sites should be rotated between right and left Needle Size: [] 1.5  [] 2 (For BMI >/= 30)   There is no height or weight on file to calculate BMI.  Gauge: [] 21G [] 22G [] 23G  Site: [] Left Ventrogluteal  [] Right Ventrogluteal  Time of Administration:    [x]  []  ISR Review completed, ISR photography necessary [] Yes[x] No     All Visits *Digital photographs will be documented at all visits (scheduled or unscheduled) on all participants who have an injection site reaction that is a visible, persistent Grade 2 (no improvement/resolution in >10 days), Grade >/=3, or serious. *Examination will include a physician/HCP assessment of pain (or tenderness), pruritus, warmth, infections, rash, erythema (or redness), swelling (or  induration), and nodules (granulomas or cysts) *For injection site nodules, the largest diameter will be recorded in the eCRF at each ISR assessment for nodules of at least Grade 1 severity  *Remote Visits: M6+2W, M7+2W, M8+2W, M10, M12, M15, M19, M23, M27, M31 should assess whether participants have any new ISR symptoms/signs since their last visit or have previously reported ISR that has not improved or is worsening, if present site should perform an unscheduled visits ASAP for ISR assessment   Yes No/NA  Pharmacokinetic Sampling  []   [x]  Post Dose PK sampling: 2 hours after injection on dosing days(+/- 30 mins collection window Visits:1 week, M1, M1+1Wk, M2, M3, M3+1Wk, M4, M5, M5+1W, M6, M7, M8, M9, , M9+1Wk, M11, M13, M33, ED, 4WFU, LTFU   Time of Injection:   Time of PK Collection:     Yes No Completed:  []  [x]   HIV/STI  Counseling/Condom Distribution:    All Visits, except remote calls [] Accepted  [] Declined   Yes No/NA Completed  [x]  []  Was there an ISR during remote phone call visit:  [] Yes [x]  No If yes, schedule unscheduled ISR assessment ASAP Date:   [x]  []  Instructed to contact site if ISR gets worse or does not improve after 10 days from the last ISR assessment.  [x]  []  Assess for Protocol Deviations  [x]  []  Reminders:  Participants of childbearing potential agree to use a highly effective method of contraception consistently and correctly HIV vaccines are not  permitted at any time during the study No other experimental agents, antiretroviral drugs, cytotoxic chemotherapy, or radiation therapy my not be administered throughout the trial No systemically administered immunomodulator's permitted Notify study staff if you experience any signs/symptoms, visit provider in ER or scheduled evaluation, or start any new OTC, supplemental or prescription therapies   Yes No Completed:  [x]  []  Compensation:  You will receive a payment of $78.00 at the end of each non dosing visit  completed to reimburse you for your time and effort.  You will receive $125.00 at the end of each dosing visit completed to reimburse you for your time and effort.   If you must return to the clinic for an unscheduled visit, you will receive $78.   Amount: $78.00   [x]  []  Schedule Next Visit: Visits should be planned using a calendar months and scheduled based on the date of the Day 1 injection.    Remote Call Visits schedule on M6+2 wk, M7+2wk, M8+2wk, M10, M12, M15, M19, M23, M27, M31 (Assess ISR, AES, Conmeds)  If transitioning to CAB 200mg /ml do not need LTFU visits, but do need ED visit and 4 week followup visit.  Date: 29SEP2025     Time: 0830   Version 2.2 14Jul2025 JLS

## 2023-12-29 NOTE — Progress Notes (Signed)
 I have reviewed this participants exam findings, and laboratory values for this EXTEND visit.

## 2024-01-18 ENCOUNTER — Other Ambulatory Visit: Payer: Self-pay

## 2024-01-18 DIAGNOSIS — Z006 Encounter for examination for normal comparison and control in clinical research program: Secondary | ICD-10-CM

## 2024-01-18 NOTE — Research (Addendum)
 ROBERTS [id number]                                                  Protocol Amendment 02  [facility]:  [phone]                                                                  TREATMENT PHASE  Visit: []  Day1+1W     []  M1    []  M1+1W    []  M2    []  M3    []  M3+1W    []  M4    [] M5     [] M5+1W   [] M6   [] M6+2W   [x] M7   [] M7+2W   [] M8   [] M8+2W   [] M9 [] M9+1W   [] M10   [] M11   [] M12   [] M13   [] M15    [] M17   [] M19   [] M21   [] M23   [] M25   [] M27   [] M29   [] M31   [] M33   [] ED  [] 4 wk followup (if not entering LTFU)   [] LTFU M1   [] LTFU M4   [] LTFU M8   [] LTFU M12     Date: 29SEP2025         4 Letter Code: MELO Subject ID: [id number]  Assessments should occur in the following order:  eCSSRs  All other questionnaires  Triplicate 12-lead ECG  Vital signs  Blood draws (clinical laboratory tests, pre-dose pharmacokinetics, etc)  IP injection  Post-dose pharmacokinetics (if required)  Yes No Completed:  [x]  []  Verify participant identity before doing any study procedures   All Visits  [x]  []  Verify correct version of ICF is signed   All Visits   Yes No Questionnaires Completed: Patient Reported Outcome Assessment must be completed before other assessments take place at each designated visit. Completed electronically by participants.  []  [x]  eCSSRs    Visits: M1, M3, M5, M9, M13, M17, M21, M25, M29, M33, ED Any positive  (abnormal) response indicating SIB or any unusual changes in behavior, confirmed by the investigator will result in their discontinuation of study intervention, the PI/SI will arrange for urgent specialist psychiatric evaluation and management  []  [x]  Social Determinants of Health Questionnaire (SDoH)     Visits: M17, ED  []  [x]  Study Medication Satisfaction Questionnaire (SMSQs)     Visits: M1, M3, M5, M9, M13, M17, M21, M25, M29, M33, ED  []  [x]  PrEP Preference and Rationale Questionnaire (only if prior exposure to oral PrEP)     Visits: M9, M17, M25, M33, ED  []  [x]   Acceptability of Injection Questionnaire     Visits: M1, M3, M5, M9, M13, M17, M21, M25, M29, M33, ED  []  [x]  Generalized Anxiety Disorders (GAD-&) Questionnaire     Visits: M1, M3, M5, M9, M13, M17, M21, M25, M29, M33, ED  []  [x]  HIV-risk and PrEP use Anxiety and Stigma Questionnaire prior to the study drug administration     Visits: M17, M29, M33, ED  []  [x]  Sexual Health Practices Questionnaire-administer prior to study drug administration Visits: M1, M3, M5, M9, M13, M17, M21, M25, M29, M33, ED  Yes No Completed:  [x]  []  Review Changes in Medical History    All Visits  [x]  []  Review Changes in Concomitant Medications    All Visits  [x]  []  Assess for AEs, SAEs: All AEs (from Day 1 onwards) and SAEs (from Screening onwards) must be recorded in eCRF at all visits and reported within required timelines.    All Visits  Is participant experiencing any symptomatic STIs currently? [] Yes [x] No    [x]  []  Pull Review in: Medication Review Medication Indication Dose/Route/Frequency Start Date End Date  Magnesium Glycinate Prophylaxis 50mg  PO QD 01Dec2024 31MAR2025  Nicotinamide Riboside Chloride Powder Prophylaxis 1 Capsule QD 01Dec2024 31MAR2025  Resveratrol Prophylaxis 1 Capsule QD 01Dec2024 31MAR2025  Cephalexin Urinary Tract Infection 500 mg tab QD 06Mar2025 11Mar2025  Amoxicillin   Urinary Tract Infection 875 mg tab BID 12Mar2025 22Mar2025  phenazopyridine  (PYRIDIUM )  Urinary Tract Infection 200 MG tablet TID 12Mar2025 17Mar2025  phenazopyridine  (PYRIDIUM )  Urinary Tract Infection 200 MG tablet TID 28mar2025 09APR2025  Ciprofloxacin  (Cipro ) Urinary tract infection 500 mg PO BID 28MAR2025 02APR2025  nitrofurantoin , macrocrystal-monohydrate, (MACROBID )  Urinary Tract Infection 100 mg PO BID  02APR2025 09APR2025  famotidine  (PEPCID )  nausea 20 mg PO BID 29MAR2025   iohexol  (OMNIPAQUE ) 9 MG/ML Oral contrast Abdominal pain  oral solution 500 mL once 29MAR2025 29MAR2025  alum & mag  hydroxide-simeth (MAALOX/MYLANTA)  200-200-20 MG/5ML  nausea Oral suspension 30 mL once 29MAR2025 29MAR2025  lidocaine  (XYLOCAINE ) 2 %  nausea 15 mL PO once 29MAR2025 29MAR2025  lactated ringers  bolus 1,000 mL  lightheadedness 1,000 mL Intravenous once 29MAR2025 29MAR2025  ondansetron  (ZOFRAN )   nausea 4 mg IV once  29MAR2025 29MAR2025  ketorolac  (TORADOL )  Abdominal pain 15 mg Intravenous once 29MAR2025 29MAR2025  iohexol  (OMNIPAQUE ) 300 MG/ML Intravenous Contrast  Abdominal pain  80 mL IV once 29MAR2025 29MAR2025  ibuprofen Right ventrogluteal injection site tenderness 200mg  PO Q8 hours 02JUN2025 09JUN2025  metroNIDAZOLE (FLAGYL)  diarrhea 500mg  PO TID 04SEP2025 14SEP2025  Iron anemia 65mg  PO QD 04SEP2025 06SEP2025  Vitamin C anemia 125mg  PO QD 04SEP2025 06SEP2025  metroNIDAZOLE (FLAGYL)  diarrhea 500mg  PO TID 26SEP2025    AE Review Diagnosis/Symptoms Indicate: NSAE SAE AESI Maximum Grade Related/Not Related to study intervention CAB LA or CAB ULA Action/Dose Change Start Date (Time, if available) End Date (Time, if available)  Right Ventrogluteal Injection Site Tenderness NSAE  1 Related to CAB LA No Change, No action taken 28Feb2025 02Mar2025  Urinary Tract Infection NSAE 2 Not Related to study intervention No change, no action taken 04Mar2025 22Mar2025  Urinary tract infection NSAE 2 Not related to study intervention No change, no action taken 28MAR2025 09APR2025  Abdominal pain NSAE 2 Not related to study intervention No change, no action taken 29MAR2025 09APR2025  Nausea NSAE 2 Not related to study intervention No change, no action taken 29MAR2025 09APR2025  Vomiting NSAE 2 Not related to study intervention No change, no action taken 29MAR2025 09APR2025  Lightheadedness NSAE 2 Not related to study intervention No change, no action taken 29MAR2025 30MAR2025  Hiatal hernia NSAE 2 Not related to study intervention No change, no action taken 29MAR2025   Uterine leiomyoma, unspecified  location  NSAE 2 Not related to study intervention No change, no action taken 29MAR2025   Right ventrogluteal injection site tenderness NSAE 1 Related to CAB ULA No change, no action 02JUN2025 09JUN2025  Viral Diarrhea NSAE 2 Not related to study intervention No change, no action 01SEP2025 05SEP2025  Viral Diarrhea NSAE 2 Not related to study intervention No change,  No action 24SEP2025    Medical History Review  Diagnosis Grade Start Date End Date  Bradycardia, asymptomatic 1 Unk 2025   Irritable Bowel Syndrome 2 Unk 2023   Intermittent Low Back Pain 2 05Aug2022   Conjunctivitis of Left Eye 2 08Jul2023 17Jul2023  Urinary Tract Infection 2 12Jan2024 17Jan2024  Anemia 2 17Jun2024       [x]  []  Contraception Review: Report in [facility] Visits: M1, M2, M3, M4, M5, M6, M7, M8, M9, M11, M13, M17, M21,M25, M29, M31, M33, ED, 4WFU, LTFU Visits Female of childbearing potential: Must agree to use a highly effective method of contraception consistently and correctly, must also continue to use adequate contraception methods for at least 52 weeks after the last injection. Female condoms must be used in addition to hormonal contraception Sexual Abstinence: considered a highly effective method only if defined as refraining from heterosexual intercourse during the entire period of risk associated with the study intervention.  The reliability of sexual abstinence needs to be evaluated in relation to the duration of the study and the preferred and usual lifestyle of the participant.   Yes No/NA Completed:  []  [x]  Premature cardiovascular disease: female participant <65 years or female participant <55 years in first degree relatives only.  Note changes since the start of the study.     Months 5, 13, 21, 29      [] No Changes    []  [x]  Substance Usage: (list name of substance, amount used, start and stop dates)  Months 5, 53, 47, 46 Social History   Substance and Sexual Activity  Alcohol Use Not Currently    Alcohol/week: 1.0 standard drink of alcohol   Types: 1 Glasses of wine per week    Social History   Substance and Sexual Activity  Drug Use Yes   Types: Marijuana   Comment: + on urine screen    Tobacco Use: Low Risk  (12/24/2023)   Received from [facility]   Patient History    Smoking Tobacco Use: Never    Smokeless Tobacco Use: Never    Passive Exposure: Not on file    Caffeine Use:  caffeine containing beverages/day  []  [x]  Triplicate 12-lead ECG     Visit: M1, M3, M5, M9, M29 Perform in a semi-supine position after 5 minutes of rest.  3 individual ECG tracing should be obtained as closely as possible in succession, but no more than 2 minutes apart. The full set of triplicates should be completed in less than 6 minutes. If ECG abnormal clinically significant  then report as an AE/SAE.  Rest Start Time:  ECG #1 start time:     ECG #2:     ECG #3:    []  [x]  Vital Signs:  [] Weight, BMI (after 5 mins in a semi-supine position)    Only on Visits: M1, M3, M5, M9, M13, M17, M21, M25, M29, M33, ED, 4W FU, LTFU Visits  [] Height    Visit: M9  [] BP and Pulse (measured in a semi supine position after 5 minutes rest)     Visit: M1, M3, M5, M9, M13, M17, M21, M25, M29, M33, ED, 4WFU  [] Temperature and RR:    Visit: M13, ED  Rest Start Time:   Vital Signs Start Time:     There is no height or weight on file to calculate BMI.  There were no vitals filed for this visit.   [x]  []  Brief Symptom Directed Physical Examination: Will include at a minimum, assessments of skin, lungs, CV,  abdomen (liver and spleen). Any abnormalities must be noted in the CRF (e.g current medical conditions or AE logs).     Visit: M1, M3, M5, M6, M7, M8, M9, M11, M13, M17, M21, M25, M29, M33, ED, 4W FU, LTFU   Physical Exam Constitutional:      Appearance: Normal appearance.  Cardiovascular:     Rate and Rhythm: Normal rate and regular rhythm.  Pulmonary:     Effort: Pulmonary effort is normal.     Breath  sounds: Normal breath sounds.  Abdominal:     General: Abdomen is flat. Bowel sounds are normal.     Palpations: Abdomen is soft.  Musculoskeletal:        General: Normal range of motion.  Skin:    General: Skin is warm and dry.  Neurological:     General: No focal deficit present.     Mental Status: She is alert and oriented to person, place, and time. Mental status is at baseline.  Psychiatric:        Mood and Affect: Mood normal.        Behavior: Behavior normal.        Thought Content: Thought content normal.        Judgment: Judgment normal.       Yes No Blood Draw Tracking:  All Visits  [x]  []  Verified previous Hgb to be >= 10 (if value <10, adjust volume per unit SOP)                          [] NA, healthy volunteer, previous Hgb results are unavailable  [x]  []  Does participant report non-study blood collection in the previous 2 months?  [x] Yes, estimated vol. 10mL    []  No  []  [x]  Assess Fasting Status.   Visits: M13, M33                                                      Is participant Fasting for at least 6 hours, overnight fast preferred?  []  No   []  Yes, Date/Time of last meal?   [x]  []  Blood Drawn.     Problems?  [x]  No   [] Yes, why?   Time: 0842    Total Vol: 14 mL   Yes No Lab Collection:  []  [x]  RAPID HIV TESTING        Visit: M1, M3, M5, M9, M13, M17, M21, M25, M29, M33, ED, 4WFU  [] REACTIVE or [] NON REACTIVE  If positive/reactive participant must be withdrawn from the study even if subsequent confirmatory testing is negative. If reactive participant referred for further testing and clinical management as per local standard of care  Is participant experiencing any signs or symptoms consistent with acute HIV infection  [] Yes [x] No   [x]  []  Chemistry     Visits: M1, M3, M4, M5, M6, M7,M8, M9, M11, M13, M17, M21, M25, M29, M33, ED, 4WFU, LTFU  [x]  []  Hematology     Visits: M1, M3, M4, M5, M6, M7, M8, M9, M11, M13, M17, M21, M25, M29, M33, ED, 4WFU, LTFU  []   [x]  Non rapid HIV immunoassay    Visits: M1, M3, M5, M9, M13, M17, M21, M25, M29, M33, ED, 4WFU LTFU  []  [x]  HIV-1 RNA     Visits: M1, M3, M5, M9, M13, M17,  M21, M25, M29, M33, ED, 4WFU, LTFU  []  [x]  Neisseria gonorrhea (GC), Chlamydia trachomatis (CT), Trichomononas vaginalis (TV), syphilis.  If positive we will refer for treatment as per local guidelines.   Visit: M1, M3, M5, M9, M13, M17, M21, M25, M29, M33, ED, 4WFU, LTFU, PRN   **PRN if symptomatic at other visits**  [] GC/CT NAAT urine/vaginal swab  *Rectal and oropharyngeal swab can be collected based on reported sexual history. [] GC and CT: Rectal Swab [] Oropharyngeal Swab [] TV: vaginal Swab  *If female and menstruating may take sample at next visit  []  [x]  Coagulation Tests     Visit: M13, M33  []  [x]  Hepatitis B&C     Visit: M13, M25, ED  []  [x]  Fasting Labs: Glucose     Visit: M13 and M33  (Insulin, HbA1c only collect if withdrawal visit occurs at M13 or M33)  [x]  []  Plasma for Storage     Visits: M1, M2, M3,M4, M5, M6, M7,M8, M9, M11, M13, M17, M21, M25, M29, ED, LTFU Visits  []  [x]  Predose: PK Sampling  Collect < 1 hr before injection Visits: M1, M3, M5, M9, M13, M17, M21, M25, M29  []  [x]  Illicit Drug Screen     Visits: M5, M13, M21, M29, M33  []  [x]  Urinalysis: Morning specimen is preferred     Visits: M13, M33, ED, 4WFU  Time of Collection:   LMP: No LMP recorded.  []  N/A   Yes No/NA Lab Collection, Cont.:  []  [x]  Urine Pregnancy Test (POCBP only)  Visits: M1, M3, M5, M9, M13, M17, M21, M25, M29, M33 (resulted prior to receipt of IP), ED, 4WFU, LTFU   Time of Collection:     [] NA, assigned Female at birth or Not a POCBP  []                          [x]  Serum Pregnancy Test (POCBP only) only if urine test positive, perform a serum test to confirm.   Yes No/NA Study Drug Administration  []  [x]  CAB LA Administration:     Visit: M1        Needle Size: [] 1.5  [] 2 (For BMI >/= 30)   There is no height or weight on file to  calculate BMI. Gauge: [] 21G [] 22G [] 23G  Site: [] Left Ventrogluteal  [] Right Ventrogluteal  Time of Administration:    []  [x]  CAB ULA Administration:   Visit: M3, M5, M9, M13, M17, M21, M25, M29  Sites should be rotated between right and left Needle Size: [] 1.5  [] 2 (For BMI >/= 30)   There is no height or weight on file to calculate BMI.  Gauge: [] 21G [] 22G [] 23G  Site: [] Left Ventrogluteal  [] Right Ventrogluteal  Time of Administration:    [x]  []  ISR Review completed, ISR photography necessary [] Yes[x] No     All Visits *Digital photographs will be documented at all visits (scheduled or unscheduled) on all participants who have an injection site reaction that is a visible, persistent Grade 2 (no improvement/resolution in >10 days), Grade >/=3, or serious. *Examination will include a physician/HCP assessment of pain (or tenderness), pruritus, warmth, infections, rash, erythema (or redness), swelling (or induration), and nodules (granulomas or cysts) *For injection site nodules, the largest diameter will be recorded in the eCRF at each ISR assessment for nodules of at least Grade 1 severity  *Remote Visits: M6+2W, M7+2W, M8+2W, M10, M12, M15, M19, M23, M27, M31 should assess whether participants have any new ISR symptoms/signs since their last  visit or have previously reported ISR that has not improved or is worsening, if present site should perform an unscheduled visits ASAP for ISR assessment   Yes No/NA  Pharmacokinetic Sampling  [x]  []   Post Dose PK sampling: 2 hours after injection on dosing days(+/- 30 mins collection window Visits:1 week, M1, M1+1Wk, M2, M3, M3+1Wk, M4, M5, M5+1W, M6, M7, M8, M9, , M9+1Wk, M11, M13, M33, ED, 4WFU, LTFU     Time of PK Collection: 0842    Yes No Completed:  [x]  []   HIV/STI  Counseling/Condom Distribution:    All Visits, except remote calls [] Accepted  [x] Declined   Yes No/NA Completed  [x]  []  Was there an ISR during remote phone call visit:   [] Yes [x]  No If yes, schedule unscheduled ISR assessment ASAP Date:   [x]  []  Instructed to contact site if ISR gets worse or does not improve after 10 days from the last ISR assessment.  [x]  []  Assess for Protocol Deviations  [x]  []  Reminders:  Participants of childbearing potential agree to use a highly effective method of contraception consistently and correctly HIV vaccines are not permitted at any time during the study No other experimental agents, antiretroviral drugs, cytotoxic chemotherapy, or radiation therapy my not be administered throughout the trial No systemically administered immunomodulator's permitted Notify study staff if you experience any signs/symptoms, visit provider in ER or scheduled evaluation, or start any new OTC, supplemental or prescription therapies   Yes No Completed:  [x]  []  Compensation:  You will receive a payment of $78.00 at the end of each non dosing visit completed to reimburse you for your time and effort.  You will receive $125.00 at the end of each dosing visit completed to reimburse you for your time and effort.   If you must return to the clinic for an unscheduled visit, you will receive $78.   Amount: $78.00   [x]  []  Schedule Next Visit: Visits should be planned using a calendar months and scheduled based on the date of the Day 1 injection.    Remote Call Visits schedule on M6+2 wk, M7+2wk, M8+2wk, M10, M12, M15, M19, M23, M27, M31 (Assess ISR, AES, Conmeds)  If transitioning to CAB 200mg /ml do not need LTFU visits, but do need ED visit and 4 week followup visit.  Date: 13OCT2025     Time: 0830   Version 2.2 14Jul2025 JLS

## 2024-01-20 ENCOUNTER — Telehealth: Payer: Self-pay

## 2024-01-20 NOTE — Telephone Encounter (Signed)
 Text send about lab results from Extend study visit month 7. Encouraged increasing hydration.

## 2024-01-20 NOTE — Research (Signed)
 Updated to reflect labs. Medication Review Medication Indication Dose/Route/Frequency Start Date End Date  Magnesium Glycinate Prophylaxis 50mg  PO QD 01Dec2024 31MAR2025  Nicotinamide Riboside Chloride Powder Prophylaxis 1 Capsule QD 01Dec2024 31MAR2025  Resveratrol Prophylaxis 1 Capsule QD 01Dec2024 31MAR2025  Cephalexin Urinary Tract Infection 500 mg tab QD 06Mar2025 11Mar2025  Amoxicillin   Urinary Tract Infection 875 mg tab BID 12Mar2025 22Mar2025  phenazopyridine  (PYRIDIUM )  Urinary Tract Infection 200 MG tablet TID 12Mar2025 17Mar2025  phenazopyridine  (PYRIDIUM )  Urinary Tract Infection 200 MG tablet TID 28mar2025 09APR2025  Ciprofloxacin  (Cipro ) Urinary tract infection 500 mg PO BID 28MAR2025 02APR2025  nitrofurantoin , macrocrystal-monohydrate, (MACROBID )  Urinary Tract Infection 100 mg PO BID  02APR2025 09APR2025  famotidine  (PEPCID )  nausea 20 mg PO BID 29MAR2025   iohexol  (OMNIPAQUE ) 9 MG/ML Oral contrast Abdominal pain  oral solution 500 mL once 29MAR2025 29MAR2025  alum & mag hydroxide-simeth (MAALOX/MYLANTA)  200-200-20 MG/5ML  nausea Oral suspension 30 mL once 29MAR2025 29MAR2025  lidocaine  (XYLOCAINE ) 2 %  nausea 15 mL PO once 29MAR2025 29MAR2025  lactated ringers  bolus 1,000 mL  lightheadedness 1,000 mL Intravenous once 29MAR2025 29MAR2025  ondansetron  (ZOFRAN )   nausea 4 mg IV once  29MAR2025 29MAR2025  ketorolac  (TORADOL )  Abdominal pain 15 mg Intravenous once 29MAR2025 29MAR2025  iohexol  (OMNIPAQUE ) 300 MG/ML Intravenous Contrast  Abdominal pain  80 mL IV once 29MAR2025 29MAR2025  ibuprofen Right ventrogluteal injection site tenderness 200mg  PO Q8 hours 02JUN2025 09JUN2025  metroNIDAZOLE (FLAGYL)  diarrhea 500mg  PO TID 04SEP2025 14SEP2025  Iron anemia 65mg  PO QD 04SEP2025 06SEP2025  Vitamin C anemia 125mg  PO QD 04SEP2025 06SEP2025  metroNIDAZOLE (FLAGYL)  diarrhea 500mg  PO TID 26SEP2025    AE Review Diagnosis/Symptoms Indicate: NSAE SAE AESI Maximum Grade  Related/Not Related to study intervention CAB LA or CAB ULA Action/Dose Change Start Date (Time, if available) End Date (Time, if available)  Right Ventrogluteal Injection Site Tenderness NSAE  1 Related to CAB LA No Change, No action taken 28Feb2025 02Mar2025  Urinary Tract Infection NSAE 2 Not Related to study intervention No change, no action taken 04Mar2025 22Mar2025  Urinary tract infection NSAE 2 Not related to study intervention No change, no action taken 28MAR2025 09APR2025  Abdominal pain NSAE 2 Not related to study intervention No change, no action taken 29MAR2025 09APR2025  Nausea NSAE 2 Not related to study intervention No change, no action taken 29MAR2025 09APR2025  Vomiting NSAE 2 Not related to study intervention No change, no action taken 29MAR2025 09APR2025  Lightheadedness NSAE 2 Not related to study intervention No change, no action taken 29MAR2025 30MAR2025  Hiatal hernia NSAE 2 Not related to study intervention No change, no action taken 29MAR2025   Uterine leiomyoma, unspecified location  NSAE 2 Not related to study intervention No change, no action taken 29MAR2025   Right ventrogluteal injection site tenderness NSAE 1 Related to CAB ULA No change, no action 02JUN2025 09JUN2025  Viral Diarrhea NSAE 2 Not related to study intervention No change, no action 01SEP2025 05SEP2025  Viral Diarrhea NSAE 2 Not related to study intervention No change, No action 24SEP2025   Increased creatine, grade 3 NSAE 3 Not related to Study intervention No change, no action 01OCT2025    Medical History Review  Diagnosis Grade Start Date End Date  Bradycardia, asymptomatic 1 Unk 2025   Irritable Bowel Syndrome 2 Unk 2023   Intermittent Low Back Pain 2 05Aug2022   Conjunctivitis of Left Eye 2 08Jul2023 17Jul2023  Urinary Tract Infection 2 12Jan2024 17Jan2024  Anemia 2 17Jun2024

## 2024-01-28 ENCOUNTER — Encounter: Payer: Self-pay | Admitting: Gastroenterology

## 2024-02-01 ENCOUNTER — Other Ambulatory Visit: Payer: Self-pay

## 2024-02-01 DIAGNOSIS — Z006 Encounter for examination for normal comparison and control in clinical research program: Secondary | ICD-10-CM

## 2024-02-01 NOTE — Research (Cosign Needed)
 ROBERTS [id number]                                                  Protocol Amendment 02  [facility]:  [phone]                                                                  TREATMENT PHASE  Visit: [] M6   [] M6+2W   [] M7   [x] M7+2W    [] M8   [] M8+2W   [] M9    [] M9+1W   [] M10   [] M11   [] M12   [] M13   [] M15    [] M17   [] M19   [] M21   [] M23   [] M25   [] M27   [] M29    [] M31   [] M33   [] ED  [] 4 wk followup (if not entering LTFU)   [] LTFU M1   [] LTFU M4   [] LTFU M8   [] LTFU M12     Date: 13OCT2025         4 Letter Code: MELO Subject ID: [id number]  Assessments should occur in the following order:  eCSSRs  All other questionnaires  Triplicate 12-lead ECG  Vital signs  Blood draws (clinical laboratory tests, pre-dose pharmacokinetics, etc)  IP injection  Post-dose pharmacokinetics (if required)  Yes No Completed:  [x]  []  Verify participant identity before doing any study procedures   All Visits  [x]  []  Verify correct version of ICF is signed   All Visits   Yes No Questionnaires Completed: Patient Reported Outcome Assessment must be completed before other assessments take place at each designated visit. Completed electronically by participants.  []  [x]  eCSSRs    Visits: M9, M13, M17, M21, M25, M29, M33, ED Any positive  (abnormal) response indicating SIB or any unusual changes in behavior, confirmed by the investigator will result in their discontinuation of study intervention, the PI/SI will arrange for urgent specialist psychiatric evaluation and management  []  [x]  Social Determinants of Health Questionnaire (SDoH)     Visits: M17, ED  []  [x]  Study Medication Satisfaction Questionnaire (SMSQs)     Visits: M9, M13, M17, M21, M25, M29, M33, ED  []  [x]  PrEP Preference and Rationale Questionnaire (only if prior exposure to oral PrEP)     Visits: M9, M17, M25, M33, ED  []  [x]  Acceptability of Injection Questionnaire     Visits: M9, M13, M17, M21, M25, M29, M33, ED  []  [x]  Generalized Anxiety  Disorders (GAD-&) Questionnaire     Visits: M9, M13, M17, M21, M25, M29, M33, ED  []  [x]  HIV-risk and PrEP use Anxiety and Stigma Questionnaire prior to the study drug administration     Visits: M17, M29, M33, ED  []  [x]  Sexual Health Practices Questionnaire-administer prior to study drug administration Visits: M9, M13, M17, M21, M25, M29, M33, ED    Yes No Completed:  [x]  []  Review Changes in Medical History    All Visits  [x]  []  Review Changes in Concomitant Medications    All Visits  [x]  []  Assess for AEs, SAEs: All AEs (from Day 1 onwards) and SAEs (from Screening onwards) must be recorded in eCRF  at all visits and reported within required timelines.    All Visits  Is participant experiencing any symptomatic STIs currently?  [] Yes [x] No (if yes, perform STI testing)  Is participant experiencing any signs or symptoms consistent with acute HIV infection?  [] Yes [x] No (if yes, perform HIV testing)   [x]  []  Pull Review in: Medication Review Medication Indication Dose/Route/Frequency Start Date End Date  Magnesium Glycinate Prophylaxis 50mg  PO QD 01Dec2024 31MAR2025  Nicotinamide Riboside Chloride Powder Prophylaxis 1 Capsule QD 01Dec2024 31MAR2025  Resveratrol Prophylaxis 1 Capsule QD 01Dec2024 31MAR2025  Cephalexin Urinary Tract Infection 500 mg tab QD 06Mar2025 11Mar2025  Amoxicillin   Urinary Tract Infection 875 mg tab BID 12Mar2025 22Mar2025  phenazopyridine  (PYRIDIUM )  Urinary Tract Infection 200 MG tablet TID 12Mar2025 17Mar2025  phenazopyridine  (PYRIDIUM )  Urinary Tract Infection 200 MG tablet TID 28mar2025 09APR2025  Ciprofloxacin  (Cipro ) Urinary tract infection 500 mg PO BID 28MAR2025 02APR2025  nitrofurantoin , macrocrystal-monohydrate, (MACROBID )  Urinary Tract Infection 100 mg PO BID  02APR2025 09APR2025  famotidine  (PEPCID )  nausea 20 mg tab PO BID 29MAR2025   iohexol  (OMNIPAQUE ) 9 MG/ML Oral contrast Abdominal pain  oral solution 500 mL once 29MAR2025 29MAR2025  alum & mag  hydroxide-simeth (MAALOX/MYLANTA)  200-200-20 MG/5ML  nausea Oral suspension 30 mL once 29MAR2025 29MAR2025  lidocaine  (XYLOCAINE ) 2 %  nausea 15 mL PO once 29MAR2025 29MAR2025  lactated ringers  bolus 1,000 mL  lightheadedness 1,000 mL Intravenous once 29MAR2025 29MAR2025  ondansetron  (ZOFRAN )   nausea 4 mg IV once  29MAR2025 29MAR2025  ketorolac  (TORADOL )  Abdominal pain 15 mg Intravenous once 29MAR2025 29MAR2025  iohexol  (OMNIPAQUE ) 300 MG/ML Intravenous Contrast  Abdominal pain  80 mL IV once 29MAR2025 29MAR2025  ibuprofen Right ventrogluteal injection site tenderness 200mg  tab PO Q8 hours 02JUN2025 09JUN2025  metroNIDAZOLE (FLAGYL)  diarrhea 500mg  tab PO TID 04SEP2025 14SEP2025  Iron anemia 65mg  tab PO QD 04SEP2025 06SEP2025  Vitamin C anemia 125mg  tab PO QD 04SEP2025 06SEP2025  metroNIDAZOLE (FLAGYL)  diarrhea 500mg  tab PO TID 26SEP2025 06OCT2025   AE Review Diagnosis/Symptoms Indicate: NSAE SAE AESI Maximum Grade Related/Not Related to study intervention CAB LA or CAB ULA Action/Dose Change Start Date (Time, if available) End Date (Time, if available)  Right Ventrogluteal Injection Site Tenderness NSAE  1 Related to CAB LA No Change, No action taken 28Feb2025 02Mar2025  Urinary Tract Infection NSAE 2 Not Related to study intervention No change, no action taken 04Mar2025 22Mar2025  Urinary tract infection NSAE 2 Not related to study intervention No change, no action taken 28MAR2025 09APR2025  Abdominal pain NSAE 2 Not related to study intervention No change, no action taken 29MAR2025 09APR2025  Nausea NSAE 2 Not related to study intervention No change, no action taken 29MAR2025 09APR2025  Vomiting NSAE 2 Not related to study intervention No change, no action taken 29MAR2025 09APR2025  Lightheadedness NSAE 2 Not related to study intervention No change, no action taken 29MAR2025 30MAR2025  Hiatal hernia NSAE 2 Not related to study intervention No change, no action taken 29MAR2025    Uterine leiomyoma, unspecified location  NSAE 2 Not related to study intervention No change, no action taken 29MAR2025   Right ventrogluteal injection site tenderness NSAE 1 Related to CAB ULA No change, no action 02JUN2025 09JUN2025  Viral Diarrhea NSAE 2 Not related to study intervention No change, no action 01SEP2025 05SEP2025  Viral Diarrhea NSAE 2 Not related to study intervention No change, No action 24SEP2025 06OCT2025  Increased creatinine, grade 3 NSAE 3 Not related to Study intervention No change, no action  01OCT2025    Medical History Review  Diagnosis Grade Start Date End Date  Bradycardia, asymptomatic 1 Unk 2025   Irritable Bowel Syndrome 2 Unk 2023   Intermittent Low Back Pain 2 05Aug2022   Conjunctivitis of Left Eye 2 08Jul2023 17Jul2023  Urinary Tract Infection 2 12Jan2024 17Jan2024  Anemia 2 17Jun2024       []  [x]  Contraception Review: Report in [facility] Visits: M6, M7, M8, M9, M11, M13, M17, M21,M25, M29, M31, M33, ED, 4WFU, LTFU Visits Female of childbearing potential: Must agree to use a highly effective method of contraception consistently and correctly, must also continue to use adequate contraception methods for at least 52 weeks after the last injection. Female condoms must be used in addition to hormonal contraception Sexual Abstinence: considered a highly effective method only if defined as refraining from heterosexual intercourse during the entire period of risk associated with the study intervention.  The reliability of sexual abstinence needs to be evaluated in relation to the duration of the study and the preferred and usual lifestyle of the participant.   Yes No/NA Completed:  []  [x]  Premature cardiovascular disease: female participant <65 years or female participant <55 years in first degree relatives only.  Note changes since the start of the study.    Visits: M13, M21, M29     [] No Changes    []  [x]  Substance Usage: (list substance, amount used, start and stop  dates) Visits: M13, M21, M29 Social History   Substance and Sexual Activity  Alcohol Use Not Currently   Alcohol/week: 1.0 standard drink of alcohol   Types: 1 Glasses of wine per week    Social History   Substance and Sexual Activity  Drug Use Yes   Types: Marijuana   Comment: + on urine screen    Tobacco Use: Low Risk  (01/20/2024)   Received from [facility]   Patient History    Smoking Tobacco Use: Never    Smokeless Tobacco Use: Never    Passive Exposure: Not on file    Caffeine Use:  caffeine containing beverages/day  []  [x]  Triplicate 12-lead ECG     Visits: M9, M29 Perform in a semi-supine position after 5 minutes of rest.  3 individual ECG tracing should be obtained as closely as possible in succession, but no more than 2 minutes apart. The full set of triplicates should be completed in less than 6 minutes. If ECG abnormal clinically significant  then report as an AE/SAE.  Rest Start Time:  ECG #1 start time:     ECG #2:     ECG #3:    []  [x]  Vital Signs:  [] Weight, BMI (after 5 mins in a semi-supine position)    Only on Visits:  M9, M13, M17, M21, M25, M29, M33, ED, 4WFU, LTFU Visits  [] Height    Visit: M9  [] BP and Pulse (measured in a semi supine position after 5 minutes rest)     Visits: M9, M13, M17, M21, M25, M29, M33, ED, 4WFU  [] Temperature and RR:    Visits: M13, ED  Rest Start Time:   Vital Signs Start Time:     There is no height or weight on file to calculate BMI.  There were no vitals filed for this visit.   []  [x]  Brief Symptom Directed Physical Examination: Will include at a minimum, assessments of skin, lungs, CV, abdomen (liver and spleen). Any abnormalities must be noted in the CRF (e.g current medical conditions or AE logs).  Visit: M6, M7, M8, M9, M11, M13, M17, M21, M25, M29, M33, ED, 4W FU, LTFU   Physical Exam    Yes No Blood Draw Tracking:  All Visits  []  [x]  Verified previous Hgb to be >= 10 (if value <10, adjust  volume per unit SOP)                          [] NA, healthy volunteer, previous Hgb results are unavailable  []  [x]  Assess Fasting Status.   Visits: M13, M33                                                      Is participant Fasting for at least 6 hours, overnight fast preferred?  []  No   []  Yes, Date/Time of last meal?   []  [x]  Blood Drawn.     Problems?  []  No   [] Yes, why?   Time:     Total Vol:  mL   Yes No Lab Collection:  []  [x]  RAPID HIV TESTING        Visits: M9, M13, M17, M21, M25, M29, M33, ED, 4WFU  [] REACTIVE or [] NON REACTIVE  If positive/reactive participant must be withdrawn from the study even if subsequent confirmatory testing is negative. If reactive participant referred for further testing and clinical management as per local standard of care   []  [x]  Chemistry     Visits: M6, M7,M8, M9, M11, M13, M17, M21, M25, M29, M33, ED, 4WFU, LTFU  []  [x]  Hematology     Visits:  M7, M8, M9, M11, M13, M17, M21, M25, M29, M33, ED, 4WFU, LTFU  []  [x]  Non rapid HIV immunoassay    Visits: M9, M13, M17, M21, M25, M29, M33, ED, 4WFU LTFU  []  [x]  HIV-1 RNA     Visits: M9, M13, M17, M21, M25, M29, M33, ED, 4WFU, LTFU  []  [x]  Neisseria gonorrhea (GC), Chlamydia trachomatis (CT), Trichomononas vaginalis (TV), syphilis.  If positive we will refer for treatment as per local guidelines.   Visit: M9, M13, M17, M21, M25, M29, M33, ED, 4WFU, LTFU, PRN   **PRN if symptomatic at other visits** Appropriate swabs to be collected based on reported sexual history.  [] GC/CT NAAT urine/vaginal swab [] GC and CT: Rectal Swab [] Oropharyngeal Swab [] TV: vaginal Swab   *If female and menstruating may take sample at next visit  []  [x]  Coagulation Tests     Visits: M13, M33  []  [x]  Hepatitis B&C     Visits: M13, M25, ED  []  [x]  Fasting Labs: Glucose     Visits: M13, M33  (Insulin, HbA1c only collect if withdrawal visit occurs at M13 or M33)  []  [x]  Plasma for Storage     Visits: M6, M7,M8, M9, M11, M13,  M17, M21, M25, M29, ED, LTFU  []  [x]  Predose: PK Sampling  Collect < 1 hr before injection Visits: M9, M13, M17, M21, M25, M29  []  [x]  Illicit Drug Screen     Visits: M13, M21, M29, M33  []  [x]  Urinalysis: Morning specimen is preferred     Visits: M13, M33, ED, 4WFU  Time of Collection:   LMP: No LMP recorded.  []  N/A   Yes No/NA Lab Collection, Cont.:  []  [x]  Urine Pregnancy Test (POCBP only)  Visits: M9, M13, M17, M21, M25, M29, M33 (resulted  prior to receipt of IP), ED, 4WFU, LTFU   Time of Collection:     [] NA, assigned Female at birth or Not a POCBP  []                          [x]  Serum Pregnancy Test (POCBP only) only if urine test positive, perform a serum test to confirm.   Yes No/NA Study Drug Administration  []  [x]  CAB ULA Administration:   Visit: M9, M13, M17, M21, M25, M29  Sites should be rotated between right and left Needle Size: [] 1.5  [] 2 (For BMI >/= 30)   There is no height or weight on file to calculate BMI.  Gauge: [] 21G [] 22G [] 23G  Site: [] Left Ventrogluteal  [] Right Ventrogluteal  Time of Administration:    [x]  []  ISR Review completed, ISR photography necessary [] Yes[x] No     All Visits *Digital photographs will be documented at all visits (scheduled or unscheduled) on all participants who have an injection site reaction that is a visible, persistent Grade 2 (no improvement/resolution in >10 days), Grade >/=3, or serious. *Examination will include a physician/HCP assessment of pain (or tenderness), pruritus, warmth, infections, rash, erythema (or redness), swelling (or induration), and nodules (granulomas or cysts) *For injection site nodules, the largest diameter will be recorded in the eCRF at each ISR assessment for nodules of at least Grade 1 severity  *Remote Visits: M6+2W, M7+2W, M8+2W, M10, M12, M15, M19, M23, M27, M31 should assess whether participants have any new ISR symptoms/signs since their last visit or have previously reported ISR that has not  improved or is worsening, if present site should perform an unscheduled visits ASAP for ISR assessment   Yes No/NA  Pharmacokinetic Sampling  []  [x]   Post Dose PK sampling: 2 hours after injection on dosing days(+/- 30 mins collection window Visits: M6, M7, M8, M9, M9+1Wk, M11, M13, M33, ED, 4WFU, LTFU   Time of PK Collection:     Yes No Completed:  []  [x]   HIV/STI  Counseling/Condom Distribution:    All Visits, except remote calls [] Accepted  [] Declined   Yes No/NA Completed  [x]  []  Was there an ISR during remote phone call visit:  [] Yes [x]  No If yes, schedule unscheduled ISR assessment ASAP Date:   [x]  []  Assess for Protocol Deviations  [x]  []  Reminders:  Participants of childbearing potential agree to use a highly effective method of contraception consistently and correctly HIV vaccines are not permitted at any time during the study No other experimental agents, antiretroviral drugs, cytotoxic chemotherapy, or radiation therapy may not be administered throughout the trial No systemically administered immunomodulator's permitted Notify study staff if you experience any signs/symptoms, visit provider in ER or scheduled evaluation, or start any new OTC, supplemental or prescription therapies Instructed to contact site if ISR gets worse or does not improve after 10 days from the last ISR assessment.   Yes No Completed:  [x]  []  Compensation:  You will receive a payment of $78.00 at the end of each non dosing visit completed to reimburse you for your time and effort.  You will receive $125.00 at the end of each dosing visit completed to reimburse you for your time and effort.   If you must return to the clinic for an unscheduled visit, you will receive $78.   Amount: $78.00   [x]  []  Schedule Next Visit: Visits should be planned using a calendar months and scheduled based on the date of the Day 1  injection.    Remote Call Visits schedule on M6+2 wk, M7+2wk, M8+2wk, M10, M12, M15,  M19, M23, M27, M31 (Assess ISR, AES, Conmeds)  If transitioning to CAB 200mg /ml do not need LTFU visits, but do need ED visit and 4 week followup visit.  Date: 27OCT2025     Time: 0830   Version 2.3 07Oct2025 JLS

## 2024-02-15 ENCOUNTER — Other Ambulatory Visit: Payer: Self-pay

## 2024-02-15 ENCOUNTER — Encounter: Payer: Self-pay | Admitting: *Deleted

## 2024-02-15 DIAGNOSIS — Z006 Encounter for examination for normal comparison and control in clinical research program: Secondary | ICD-10-CM

## 2024-02-15 NOTE — Research (Signed)
 ROBERTS [id number]                                                  Protocol Amendment 02  [facility]:  [phone]                                                                  TREATMENT PHASE  Visit: [] M6   [] M6+2W   [] M7   [] M7+2W    [x] M8   [] M8+2W   [] M9    [] M9+1W   [] M10   [] M11   [] M12   [] M13   [] M15    [] M17   [] M19   [] M21   [] M23   [] M25   [] M27   [] M29    [] M31   [] M33   [] ED  [] 4 wk followup (if not entering LTFU)   [] LTFU M1   [] LTFU M4   [] LTFU M8   [] LTFU M12     Date: 27Oct2025         4 Letter Code: MELO Subject ID: [id number]  Assessments should occur in the following order:  eCSSRs  All other questionnaires  Triplicate 12-lead ECG  Vital signs  Blood draws (clinical laboratory tests, pre-dose pharmacokinetics, etc)  IP injection  Post-dose pharmacokinetics (if required)  Yes No Completed:  [x]  []  Verify participant identity before doing any study procedures   All Visits  [x]  []  Verify correct version of ICF is signed   All Visits   Yes No Questionnaires Completed: Patient Reported Outcome Assessment must be completed before other assessments take place at each designated visit. Completed electronically by participants.  []  [x]  eCSSRs    Visits: M9, M13, M17, M21, M25, M29, M33, ED Any positive  (abnormal) response indicating SIB or any unusual changes in behavior, confirmed by the investigator will result in their discontinuation of study intervention, the PI/SI will arrange for urgent specialist psychiatric evaluation and management  []  [x]  Social Determinants of Health Questionnaire (SDoH)     Visits: M17, ED  []  [x]  Study Medication Satisfaction Questionnaire (SMSQs)     Visits: M9, M13, M17, M21, M25, M29, M33, ED  []  [x]  PrEP Preference and Rationale Questionnaire (only if prior exposure to oral PrEP)     Visits: M9, M17, M25, M33, ED  []  [x]  Acceptability of Injection Questionnaire     Visits: M9, M13, M17, M21, M25, M29, M33, ED  []  [x]  Generalized Anxiety  Disorders (GAD-&) Questionnaire     Visits: M9, M13, M17, M21, M25, M29, M33, ED  []  [x]  HIV-risk and PrEP use Anxiety and Stigma Questionnaire prior to the study drug administration     Visits: M17, M29, M33, ED  []  [x]  Sexual Health Practices Questionnaire-administer prior to study drug administration Visits: M9, M13, M17, M21, M25, M29, M33, ED    Yes No Completed:  [x]  []  Review Changes in Medical History    All Visits  [x]  []  Review Changes in Concomitant Medications    All Visits  [x]  []  Assess for AEs, SAEs: All AEs (from Day 1 onwards) and SAEs (from Screening onwards) must be recorded in eCRF  at all visits and reported within required timelines.    All Visits  Is participant experiencing any symptomatic STIs currently?  [] Yes [x] No (if yes, perform STI testing)  Is participant experiencing any signs or symptoms consistent with acute HIV infection?  [] Yes [x] No (if yes, perform HIV testing)   [x]  []  Pull Review in:  Medication Review Medication Indication Dose/Route/Frequency Start Date End Date  Magnesium Glycinate Prophylaxis 50mg  PO QD 01Dec2024 31MAR2025  Nicotinamide Riboside Chloride Powder Prophylaxis 1 Capsule QD 01Dec2024 31MAR2025  Resveratrol Prophylaxis 1 Capsule QD 01Dec2024 31MAR2025  Cephalexin Urinary Tract Infection 500 mg tab QD 06Mar2025 11Mar2025  Amoxicillin   Urinary Tract Infection 875 mg tab BID 12Mar2025 22Mar2025  phenazopyridine  (PYRIDIUM )  Urinary Tract Infection 200 MG tablet TID 12Mar2025 17Mar2025  phenazopyridine  (PYRIDIUM )  Urinary Tract Infection 200 MG tablet TID 28mar2025 09APR2025  Ciprofloxacin  (Cipro ) Urinary tract infection 500 mg PO BID 28MAR2025 02APR2025  nitrofurantoin , macrocrystal-monohydrate, (MACROBID )  Urinary Tract Infection 100 mg PO BID  02APR2025 09APR2025  famotidine  (PEPCID )  nausea 20 mg tab PO BID 29MAR2025   iohexol  (OMNIPAQUE ) 9 MG/ML Oral contrast Abdominal pain  oral solution 500 mL once 29MAR2025 29MAR2025  alum & mag  hydroxide-simeth (MAALOX/MYLANTA)  200-200-20 MG/5ML  nausea Oral suspension 30 mL once 29MAR2025 29MAR2025  lidocaine  (XYLOCAINE ) 2 %  nausea 15 mL PO once 29MAR2025 29MAR2025  lactated ringers  bolus 1,000 mL  lightheadedness 1,000 mL Intravenous once 29MAR2025 29MAR2025  ondansetron  (ZOFRAN )   nausea 4 mg IV once  29MAR2025 29MAR2025  ketorolac  (TORADOL )  Abdominal pain 15 mg Intravenous once 29MAR2025 29MAR2025  iohexol  (OMNIPAQUE ) 300 MG/ML Intravenous Contrast  Abdominal pain  80 mL IV once 29MAR2025 29MAR2025  ibuprofen Right ventrogluteal injection site tenderness 200mg  tab PO Q8 hours 02JUN2025 09JUN2025  metroNIDAZOLE (FLAGYL)  diarrhea 500mg  tab PO TID 04SEP2025 14SEP2025  Iron anemia 65mg  tab PO QD 04SEP2025 06SEP2025  Vitamin C anemia 125mg  tab PO QD 04SEP2025 06SEP2025  metroNIDAZOLE (FLAGYL)  diarrhea 500mg  tab PO TID 26SEP2025 06OCT2025   AE Review Diagnosis/Symptoms Indicate: NSAE SAE AESI Maximum Grade Related/Not Related to study intervention CAB LA or CAB ULA Action/Dose Change Start Date (Time, if available) End Date (Time, if available)  Right Ventrogluteal Injection Site Tenderness NSAE  1 Related to CAB LA No Change, No action taken 28Feb2025 02Mar2025  Urinary Tract Infection NSAE 2 Not Related to study intervention No change, no action taken 04Mar2025 22Mar2025  Urinary tract infection NSAE 2 Not related to study intervention No change, no action taken 28MAR2025 09APR2025  Abdominal pain NSAE 2 Not related to study intervention No change, no action taken 29MAR2025 09APR2025  Nausea NSAE 2 Not related to study intervention No change, no action taken 29MAR2025 09APR2025  Vomiting NSAE 2 Not related to study intervention No change, no action taken 29MAR2025 09APR2025  Lightheadedness NSAE 2 Not related to study intervention No change, no action taken 29MAR2025 30MAR2025  Hiatal hernia NSAE 2 Not related to study intervention No change, no action taken 29MAR2025    Uterine leiomyoma, unspecified location  NSAE 2 Not related to study intervention No change, no action taken 29MAR2025   Right ventrogluteal injection site tenderness NSAE 1 Related to CAB ULA No change, no action 02JUN2025 09JUN2025  Viral Diarrhea NSAE 2 Not related to study intervention No change, no action 01SEP2025 05SEP2025  Viral Diarrhea NSAE 2 Not related to study intervention No change, No action 24SEP2025 06OCT2025  Increased creatinine , grade 3 NSAE 3 Not related to Study intervention No change,  no action 01OCT2025    Medical History Review  Diagnosis Grade Start Date End Date  Bradycardia, asymptomatic 1 Unk 2025   Irritable Bowel Syndrome 2 Unk 2023   Intermittent Low Back Pain 2 05Aug2022   Conjunctivitis of Left Eye 2 08Jul2023 17Jul2023  Urinary Tract Infection 2 12Jan2024 17Jan2024  Anemia 2 17Jun2024      [x]  []  Contraception Review: Report in [facility] Visits: M6, M7, M8, M9, M11, M13, M17, M21,M25, M29, M31, M33, ED, 4WFU, LTFU Visits Female of childbearing potential: Must agree to use a highly effective method of contraception consistently and correctly, must also continue to use adequate contraception methods for at least 52 weeks after the last injection. Female condoms must be used in addition to hormonal contraception Sexual Abstinence: considered a highly effective method only if defined as refraining from heterosexual intercourse during the entire period of risk associated with the study intervention.  The reliability of sexual abstinence needs to be evaluated in relation to the duration of the study and the preferred and usual lifestyle of the participant.  1 Partner (vasectomy)   Yes No/NA Completed:  []  [x]  Premature cardiovascular disease: female participant <65 years or female participant <55 years in first degree relatives only.  Note changes since the start of the study.    Visits: M13, M21, M29     [] No Changes    []  [x]  Substance Usage: (list substance, amount  used, start and stop dates) Visits: M13, M21, M29 Social History   Substance and Sexual Activity  Alcohol Use Not Currently   Alcohol/week: 1.0 standard drink of alcohol   Types: 1 Glasses of wine per week    Social History   Substance and Sexual Activity  Drug Use Yes   Types: Marijuana   Comment: + on urine screen    Tobacco Use: Low Risk  (01/20/2024)   Received from [facility]   Patient History    Smoking Tobacco Use: Never    Smokeless Tobacco Use: Never    Passive Exposure: Not on file    Caffeine Use:  caffeine containing beverages/day  []  [x]  Triplicate 12-lead ECG     Visits: M9, M29 Perform in a semi-supine position after 5 minutes of rest.  3 individual ECG tracing should be obtained as closely as possible in succession, but no more than 2 minutes apart. The full set of triplicates should be completed in less than 6 minutes. If ECG abnormal clinically significant  then report as an AE/SAE.  Rest Start Time:  ECG #1 start time:     ECG #2:     ECG #3:    []  [x]  Vital Signs:  [] Weight, BMI (after 5 mins in a semi-supine position)    Only on Visits:  M9, M13, M17, M21, M25, M29, M33, ED, 4WFU, LTFU Visits  [] Height    Visit: M9  [] BP and Pulse (measured in a semi supine position after 5 minutes rest)     Visits: M9, M13, M17, M21, M25, M29, M33, ED, 4WFU  [] Temperature and RR:    Visits: M13, ED  Rest Start Time:   Vital Signs Start Time:     There is no height or weight on file to calculate BMI.  There were no vitals filed for this visit.   [x]  []  Brief Symptom Directed Physical Examination: Will include at a minimum, assessments of skin, lungs, CV, abdomen (liver and spleen). Any abnormalities must be noted in the CRF (e.g current medical conditions  or AE logs).     Visit: M6, M7, M8, M9, M11, M13, M17, M21, M25, M29, M33, ED, 4W FU, LTFU   Physical Exam Constitutional:      Appearance: Normal appearance.  Cardiovascular:     Rate and  Rhythm: Normal rate and regular rhythm.  Pulmonary:     Effort: Pulmonary effort is normal.     Breath sounds: Normal breath sounds.  Abdominal:     General: Abdomen is flat. Bowel sounds are normal. There is no distension.     Palpations: Abdomen is soft.  Skin:    General: Skin is warm and dry.  Neurological:     Mental Status: She is alert and oriented to person, place, and time.  Psychiatric:        Mood and Affect: Mood normal.        Behavior: Behavior normal.       Yes No Blood Draw Tracking:  All Visits  [x]  []  Verified previous Hgb to be >= 10 (if value <10, adjust volume per unit SOP)                          [] NA, healthy volunteer, previous Hgb results are unavailable  []  [x]  Assess Fasting Status.   Visits: M13, M33                                                      Is participant Fasting for at least 6 hours, overnight fast preferred?  []  No   []  Yes, Date/Time of last meal?   [x]  []  Blood Drawn.     Problems?  [x]  No   [] Yes, why?   Time: 0838    Total Vol: 14 mL   Yes No Lab Collection:  []  [x]  RAPID HIV TESTING        Visits: M9, M13, M17, M21, M25, M29, M33, ED, 4WFU  [] REACTIVE or [] NON REACTIVE  If positive/reactive participant must be withdrawn from the study even if subsequent confirmatory testing is negative. If reactive participant referred for further testing and clinical management as per local standard of care   [x]  []  Chemistry     Visits: M6, M7,M8, M9, M11, M13, M17, M21, M25, M29, M33, ED, 4WFU, LTFU  [x]  []  Hematology     Visits:  M7, M8, M9, M11, M13, M17, M21, M25, M29, M33, ED, 4WFU, LTFU  []  [x]  Non rapid HIV immunoassay    Visits: M9, M13, M17, M21, M25, M29, M33, ED, 4WFU LTFU  []  [x]  HIV-1 RNA     Visits: M9, M13, M17, M21, M25, M29, M33, ED, 4WFU, LTFU  []  [x]  Neisseria gonorrhea (GC), Chlamydia trachomatis (CT), Trichomononas vaginalis (TV), syphilis.  If positive we will refer for treatment as per local guidelines.   Visit: M9, M13,  M17, M21, M25, M29, M33, ED, 4WFU, LTFU, PRN   **PRN if symptomatic at other visits** Appropriate swabs to be collected based on reported sexual history.  [] GC/CT NAAT urine/vaginal swab [] GC and CT: Rectal Swab [] Oropharyngeal Swab [] TV: vaginal Swab   *If female and menstruating may take sample at next visit  []  [x]  Coagulation Tests     Visits: M13, M33  []  [x]  Hepatitis B&C     Visits: M13, M25, ED  []  [x]  Fasting Labs: Glucose  Visits: M13, M33  (Insulin, HbA1c only collect if withdrawal visit occurs at M13 or M33)  [x]  []  Plasma for Storage     Visits: M6, M7,M8, M9, M11, M13, M17, M21, M25, M29, ED, LTFU  []  [x]  Predose: PK Sampling  Collect < 1 hr before injection Visits: M9, M13, M17, M21, M25, M29  []  [x]  Illicit Drug Screen     Visits: M13, M21, M29, M33  []  [x]  Urinalysis: Morning specimen is preferred     Visits: M13, M33, ED, 4WFU  Time of Collection:   LMP: No LMP recorded.  []  N/A   Yes No/NA Lab Collection, Cont.:  []  [x]  Urine Pregnancy Test (POCBP only)  Visits: M9, M13, M17, M21, M25, M29, M33 (resulted prior to receipt of IP), ED, 4WFU, LTFU   Time of Collection:     [] NA, assigned Female at birth or Not a POCBP  []                          [x]  Serum Pregnancy Test (POCBP only) only if urine test positive, perform a serum test to confirm.   Yes No/NA Study Drug Administration  []  [x]  CAB ULA Administration:   Visit: M9, M13, M17, M21, M25, M29  Sites should be rotated between right and left Needle Size: [] 1.5  [] 2 (For BMI >/= 30)   There is no height or weight on file to calculate BMI.  Gauge: [] 21G [] 22G [] 23G  Site: [] Left Ventrogluteal  [] Right Ventrogluteal  Time of Administration:    [x]  []  ISR Review completed, ISR photography necessary [] Yes[x] No     All Visits *Digital photographs will be documented at all visits (scheduled or unscheduled) on all participants who have an injection site reaction that is a visible, persistent Grade 2 (no  improvement/resolution in >10 days), Grade >/=3, or serious. *Examination will include a physician/HCP assessment of pain (or tenderness), pruritus, warmth, infections, rash, erythema (or redness), swelling (or induration), and nodules (granulomas or cysts) *For injection site nodules, the largest diameter will be recorded in the eCRF at each ISR assessment for nodules of at least Grade 1 severity  *Remote Visits: M6+2W, M7+2W, M8+2W, M10, M12, M15, M19, M23, M27, M31 should assess whether participants have any new ISR symptoms/signs since their last visit or have previously reported ISR that has not improved or is worsening, if present site should perform an unscheduled visits ASAP for ISR assessment   Yes No/NA  Pharmacokinetic Sampling  [x]  []   Post Dose PK sampling: 2 hours after injection on dosing days(+/- 30 mins collection window Visits: M6, M7, M8, M9, M9+1Wk, M11, M13, M33, ED, 4WFU, LTFU   Date/Time Last Injection: 28Jul2025 @ 1010     Time of PK Collection: 0838    Yes No Completed:  [x]  []   HIV/STI  Counseling/Condom Distribution:    All Visits, except remote calls [] Accepted  [x] Declined   Yes No/NA Completed  []  [x]  Was there an ISR during remote phone call visit:  [] Yes []  No If yes, schedule unscheduled ISR assessment ASAP Date:   [x]  []  Assess for Protocol Deviations  [x]  []  Reminders:  Participants of childbearing potential agree to use a highly effective method of contraception consistently and correctly HIV vaccines are not permitted at any time during the study No other experimental agents, antiretroviral drugs, cytotoxic chemotherapy, or radiation therapy may not be administered throughout the trial No systemically administered immunomodulator's permitted Notify study staff if you experience any  signs/symptoms, visit provider in ER or scheduled evaluation, or start any new OTC, supplemental or prescription therapies Instructed to contact site if ISR gets worse or  does not improve after 10 days from the last ISR assessment.   Yes No Completed:  [x]  []  Compensation:  You will receive a payment of $78.00 at the end of each non dosing visit completed to reimburse you for your time and effort.  You will receive $125.00 at the end of each dosing visit completed to reimburse you for your time and effort.   If you must return to the clinic for an unscheduled visit, you will receive $78.   Amount: $78   [x]  []  Schedule Next Visit: Visits should be planned using a calendar months and scheduled based on the date of the Day 1 injection.    Remote Call Visits schedule on M6+2 wk, M7+2wk, M8+2wk, M10, M12, M15, M19, M23, M27, M31 (Assess ISR, AES, Conmeds)  If transitioning to CAB 200mg /ml do not need LTFU visits, but do need ED visit and 4 week followup visit.  Date: 10Nov2025     Time: 0830   Version 2.3 07Oct2025 JLS

## 2024-02-18 NOTE — Progress Notes (Signed)
 Reviewed all of the data from the patient's research visit including laboratory data for this  M8 visit in  EXTEND [id number]     Study.

## 2024-02-29 ENCOUNTER — Other Ambulatory Visit: Payer: Self-pay

## 2024-02-29 DIAGNOSIS — Z006 Encounter for examination for normal comparison and control in clinical research program: Secondary | ICD-10-CM

## 2024-02-29 NOTE — Research (Cosign Needed)
 [facility]ERTS [id number]                                                  Protocol Amendment 03  [facility] Site:  [id number]                                                                  TREATMENT PHASE   Visit:  [] M7+2W     [] M8    [x] M8+2W   [] M9    [] M9+1W   [] M10    [] M11    [] M12    [] M13   [] M13+1W    [] M15    [] M17    [] M17+1W     [] M19    [] M21   [] M23    [] M25   [] M27    [] M29    [] M31   [] M33   [] ED    [] 4 wk followup (if not entering LTFU)   [] LTFU M1   [] LTFU M4    [] LTFU M8    [] LTFU M12     Date: 10NOV2025         4 Letter Code: MELO Subject ID: [id number]  Assessments should occur in the following order:  eCSSRs  All other questionnaires  Triplicate 12-lead ECG  Vital signs  Blood draws (clinical laboratory tests, pre-dose pharmacokinetics, etc)  IP injection  Post-dose pharmacokinetics (if required)  Yes No Completed:  [x]  []  Verify participant identity before doing any study procedures   All Visits  [x]  []  Verify correct version of ICF is signed   All Visits   Yes No Questionnaires Completed: Patient Reported Outcome Assessment must be completed before other assessments take place at each designated visit. Completed electronically by participants.  []  [x]  CSSRs    Visits: M9, M13, M17, M21, M25, M29, M33, ED Any positive (abnormal) response indicating SIB or any unusual changes in behavior, confirmed by the investigator will result in their discontinuation of study intervention, the PI/SI will arrange for urgent specialist psychiatric evaluation and management  []  [x]  Social Determinants of Health Questionnaire (SDoH)     Visits: M17, ED  []  [x]  Study Medication Satisfaction Questionnaire (SMSQs)     Visits: M9, M13, M17, M21, M25, M29, M33, ED  []  [x]  PrEP Preference and Rationale Questionnaire (only if prior exposure to oral PrEP)     Visits: M9, M17, M25, M33, ED  []  [x]  Acceptability of Injection Questionnaire     Visits: M9, M13, M17, M21, M25, M29, M33, ED  []  [x]  Generalized  Anxiety Disorders (GAD-&) Questionnaire     Visits: M9, M13, M17, M21, M25, M29, M33, ED  []  [x]  HIV-risk and PrEP use Anxiety and Stigma Questionnaire prior to the study drug administration     Visits: M17, M29, M33, ED  []  [x]  Sexual Health Practices Questionnaire-administer prior to study drug administration Visits: M9, M13, M17, M21, M25, M29, M33, ED    Yes No Completed:  [x]  []  Review Changes in Concomitant Medications    All Visits  [x]  []  Assess for AEs, SAEs: All AEs (from Day 1 onwards) and SAEs (from Screening onwards) must be recorded in  eCRF at all visits and reported within required timelines.    All Visits  Is participant experiencing any symptomatic STIs currently?  [] Yes [x] No (if yes, perform STI testing)  Is participant experiencing any signs or symptoms consistent with acute HIV infection?  [] Yes [x] No (if yes, perform HIV testing)   [x]  []  Pull Review in:  Medication Review Medication Indication Dose/Route/Frequency Start Date End Date  Magnesium Glycinate Prophylaxis 50mg  PO QD 01Dec2024 31MAR2025  Nicotinamide Riboside Chloride Powder Prophylaxis 1 Capsule QD 01Dec2024 31MAR2025  Resveratrol Prophylaxis 1 Capsule QD 01Dec2024 31MAR2025  Cephalexin Urinary Tract Infection 500 mg tab QD 06Mar2025 11Mar2025  Amoxicillin   Urinary Tract Infection 875 mg tab BID 12Mar2025 22Mar2025  phenazopyridine  (PYRIDIUM )  Urinary Tract Infection 200 MG tablet TID 12Mar2025 17Mar2025  phenazopyridine  (PYRIDIUM )  Urinary Tract Infection 200 MG tablet TID 28mar2025 09APR2025  Ciprofloxacin  (Cipro ) Urinary tract infection 500 mg PO BID 28MAR2025 02APR2025  nitrofurantoin , macrocrystal-monohydrate, (MACROBID )  Urinary Tract Infection 100 mg PO BID  02APR2025 09APR2025  famotidine  (PEPCID )  nausea 20 mg tab PO BID 29MAR2025 09APR2025  iohexol  (OMNIPAQUE ) 9 MG/ML Oral contrast Abdominal pain  oral solution 500 mL once 29MAR2025 29MAR2025  alum & mag hydroxide-simeth (MAALOX/MYLANTA)   200-200-20 MG/5ML  nausea Oral suspension 30 mL once 29MAR2025 29MAR2025  lidocaine  (XYLOCAINE ) 2 %  nausea 15 mL PO once 29MAR2025 29MAR2025  lactated ringers  bolus 1,000 mL  lightheadedness 1,000 mL Intravenous once 29MAR2025 29MAR2025  ondansetron  (ZOFRAN )   nausea 4 mg IV once  29MAR2025 29MAR2025  ketorolac  (TORADOL )  Abdominal pain 15 mg Intravenous once 29MAR2025 29MAR2025  iohexol  (OMNIPAQUE ) 300 MG/ML Intravenous Contrast  Abdominal pain  80 mL IV once 29MAR2025 29MAR2025  ibuprofen Right ventrogluteal injection site tenderness 200mg  tab PO Q8 hours 02JUN2025 09JUN2025  metroNIDAZOLE (FLAGYL)  diarrhea 500mg  tab PO TID 04SEP2025 14SEP2025  Iron anemia 65mg  tab PO QD 04SEP2025 06SEP2025  Vitamin C anemia 125mg  tab PO QD 04SEP2025 06SEP2025  metroNIDAZOLE (FLAGYL)  diarrhea 500mg  tab PO TID 26SEP2025 06OCT2025   AE Review Diagnosis/Symptoms Indicate: NSAE SAE AESI Maximum Grade Related/Not Related to study intervention CAB LA or CAB ULA Action/Dose Change Start Date (Time, if available) End Date (Time, if available)  Right Ventrogluteal Injection Site Tenderness NSAE  1 Related to CAB LA No Change, No action taken 28Feb2025 02Mar2025  Urinary Tract Infection NSAE 2 Not Related to study intervention No change, no action taken 04Mar2025 22Mar2025  Urinary tract infection NSAE 2 Not related to study intervention No change, no action taken 28MAR2025 09APR2025  Abdominal pain NSAE 2 Not related to study intervention No change, no action taken 29MAR2025 09APR2025  Nausea NSAE 2 Not related to study intervention No change, no action taken 29MAR2025 09APR2025  Vomiting NSAE 2 Not related to study intervention No change, no action taken 29MAR2025 09APR2025  Lightheadedness NSAE 2 Not related to study intervention No change, no action taken 29MAR2025 30MAR2025  Hiatal hernia NSAE 2 Not related to study intervention No change, no action taken 29MAR2025   Uterine leiomyoma, unspecified  location  NSAE 2 Not related to study intervention No change, no action taken 29MAR2025   Right ventrogluteal injection site tenderness NSAE 1 Related to CAB ULA No change, no action 02JUN2025 09JUN2025  Viral Diarrhea NSAE 2 Not related to study intervention No change, no action 01SEP2025 05SEP2025  Viral Diarrhea NSAE 2 Not related to study intervention No change, No action 24SEP2025 06OCT2025  Increased creatinine , grade 3 NSAE 3 Not related to Study intervention No  change, no action 01OCT2025    Medical History Review  Diagnosis Grade Start Date End Date  Bradycardia, asymptomatic 1 Unk 2025   Irritable Bowel Syndrome 2 Unk 2023   Intermittent Low Back Pain 2 05Aug2022   Conjunctivitis of Left Eye 2 08Jul2023 17Jul2023  Urinary Tract Infection 2 12Jan2024 17Jan2024  Anemia 2 17Jun2024      [x]  []  Contraception Review: Report in eCRF Visits: M8, M9, M10, M11, M13, M17, M21, M25, M29, M33, ED, 4WFU, LTFU Visits Female of childbearing potential: Must agree to use a highly effective method of contraception consistently and correctly, must also continue to use adequate contraception methods for at least 52 weeks after the last injection. Female condoms must be used in addition to hormonal contraception Sexual Abstinence: considered a highly effective method only if defined as refraining from heterosexual intercourse during the entire period of risk associated with the study intervention.  The reliability of sexual abstinence needs to be evaluated in relation to the duration of the study and the preferred and usual lifestyle of the participant.   Yes No/NA Completed:  []  [x]  Review Changes in Medical History:   Visits: M13, M21, M29, M33  Premature cardiovascular disease: female participant <65 years or female participant <55 years in first degree relatives only.  Note changes since the start of the study.    [] No Changes    Substance Usage: (list substance, amount used, start and stop dates)   Social History   Substance and Sexual Activity  Alcohol Use Not Currently   Alcohol/week: 1.0 standard drink of alcohol   Types: 1 Glasses of wine per week    Social History   Substance and Sexual Activity  Drug Use Yes   Types: Marijuana   Comment: + on urine screen    Tobacco Use: Low Risk  (02/26/2024)   Received from [facility]   Patient History    Smoking Tobacco Use: Never    Smokeless Tobacco Use: Never    Passive Exposure: Not on file    Caffeine Use:  caffeine containing beverages/day  []  [x]  Triplicate 12-lead ECG     Visits: M9, M29, ED Perform in a semi-supine position after 5 minutes of rest.  3 individual ECG tracing should be obtained as closely as possible in succession, but no more than 2 minutes apart. The full set of triplicates should be completed in less than 6 minutes. If ECG abnormal clinically significant  then report as an AE/SAE.  Rest Start Time:  ECG #1 start time:     ECG #2:     ECG #3:    []  [x]  Vital Signs:  [] Weight, BMI (measured in a semi supine position after 5 minutes rest)  Visits:  M9, M13, M17, M21, M25, M29, M33, ED, 4WFU, LTFU Visits  [] Height    Visit: M9  [] BP and Pulse (measured in a semi supine position after 5 minutes rest)  Visits: M9, M13, M17, M21, M25, M29, M33, ED, 4WFU,  LTFU Visits  [] Temperature and RR:    Visits: M13, ED  Rest Start Time:   Vital Signs Start Time:     There is no height or weight on file to calculate BMI.  There were no vitals filed for this visit.   []  [x]  Brief Physical Examination: Will include at a minimum, assessments of skin, lungs, cardiovascular system, abdomen (liver and spleen). Perform the symptom directed exam when there are symptoms present. In addition to the brief physical exam.  Any abnormalities must be noted in the CRF (e.g current medical conditions or AE logs).     Visit: M8, M9, M11, M13, M17, M21, M25, M29, M33, ED, 4WFU, LTFU   Physical Exam    Yes No  Blood Draw Tracking:  All Visits  []  [x]  Verified previous Hgb to be >= 10 (if value <10, adjust volume per unit SOP)                          [] NA, healthy volunteer, previous Hgb results are unavailable  []  [x]  Assess Fasting Status.   Visits: M13, M33                                                      Is participant Fasting for at least 6 hours, overnight fast preferred?  []  No   []  Yes, Date/Time of last meal?   []  [x]  Blood Drawn.     Problems?  []  No   [] Yes, why?   Time:     Total Vol:  mL   Yes No Lab Collection:  []  [x]  RAPID HIV TESTING        Visits: M9, M13, M17, M21, M25, M29, ED, 4WFU, LTFU  [] REACTIVE or [] NON REACTIVE  If positive/reactive participant must be withdrawn from the study even if subsequent confirmatory testing is negative. If reactive participant referred for further testing and clinical management as per local standard of care   []  [x]  Chemistry     Visits: M8, M9, M10, M11, M13, M17, M21, M25, M29, M33, ED, 4WFU, LTFU  []  [x]  Hematology     Visits:  M8, M9, M10, M11, M13, M17, M21, M25, M29, M33, ED, 4WFU, LTFU  []  [x]  Cystatin C     Visits:  M8, M9, M10, M11, M13, M17, M21, M25, M29, M33, ED, 4WFU, LTFU  []  [x]  Non rapid HIV immunoassay    Visits: M9, M13, M17, M21, M25, M29, M33, ED, 4WFU, LTFU  []  [x]  HIV-1 RNA     Visits: M9, M13, M17, M21, M25, M29, M33, ED, 4WFU, LTFU  []  [x]  Neisseria gonorrhea (GC), Chlamydia trachomatis (CT), Trichomononas vaginalis (TV), syphilis.  If positive we will refer for treatment as per local guidelines.   Visit: M9, M13, M17, M21, M25, M29, M33, ED, 4WFU, LTFU, PRN   **PRN if symptomatic at other visits** Appropriate swabs to be collected based on reported sexual history.  [] GC/CT NAAT urine/vaginal swab [] GC and CT: Rectal Swab [] Oropharyngeal Swab [] TV: vaginal Swab   *If female and menstruating may take sample at next visit  []  [x]  Coagulation Tests     Visits: M13, M33, ED  []  [x]  Hepatitis B&C     Visits:  M13, M25, ED  []  [x]  Fasting Labs: Glucose     Visits: M13, M33, ED (Insulin, HbA1c only collect if withdrawal visit occurs at M13 or M33)  []  [x]  Plasma for Storage     Visits: M8, M9, M10, M11, M13, M17, M21, M25, M29, ED  []  [x]  Predose: PK Sampling  Collect  < 1 hr before injection Visits: M9, M13, M17, M21, M25, M29  []  [x]  Illicit Drug Screen     Visits: M13, M21, M29, M33  []  [x]  Urinalysis: Morning specimen is preferred     Visits: M13,  M33, ED, 4WFU   Time of Collection:   LMP: No LMP recorded.  []  N/A   Yes No/NA Lab Collection, Cont.:  []  [x]  Urine Pregnancy Test (POCBP only)  Visits: M9, M13, M17, M21, M25, M29, M33 , ED, 4WFU, LTFU       Note: Must be resulted prior to receipt of IP  Time of Collection:     [] REACTIVE or [] NON REACTIVE  [] NA, assigned Female at birth or Not a POCBP   []                          [x]  Serum Pregnancy Test (P[facility] only) if urine test positive, perform a serum test to confirm.   Yes No/NA Study Drug Administration  []  [x]  CAB ULA Administration:   Visit: M9, M13, M17, M21, M25, M29  Sites should be rotated between right and left Needle Size: [] 1.5  [] 2 (For BMI >/= 30)   There is no height or weight on file to calculate BMI.  Gauge: [] 21G [] 22G [] 23G  Site: [] Left Ventrogluteal  [] Right Ventrogluteal  Time of Administration:    [x]  []  ISR Review completed, ISR photography necessary [] Yes[x] No     All Visits *Digital photographs will be documented at all visits (scheduled or unscheduled) on all participants who have an injection site reaction that is a visible, persistent Grade 2 (no improvement/resolution in >10 days), Grade >/=3, or serious. *Examination will include a physician/HCP assessment of pain (or tenderness), pruritus, warmth, infections, rash, erythema (or redness), swelling (or induration), and nodules (granulomas or cysts) *For injection site nodules, the largest diameter will be recorded in the eCRF at each ISR assessment for  nodules of at least Grade 1 severity  *Remote Visits: M7+2W, M8+2W, M9+1W, M12, M15, M19, M23, M27, M31 should assess whether participants have any new ISR symptoms/signs since their last visit or have previously reported ISR that has not improved or is worsening, if present site should perform an unscheduled visits ASAP for ISR assessment   Yes No/NA  Pharmacokinetic Sampling  []  [x]   Post Dose PK sampling:  Visits: M8, M9, M9+1Wk, M10, M11, M13, M13+1, M17+1, M33, ED, 4WFU, LTFU   On dosing days collect PK 2 hours after injection (+/- 30 mins collection window)  Time of PK Collection:     Yes No Completed:  []  [x]   HIV/STI  Counseling/Condom Distribution:    All Visits, except remote calls Condoms: [] Accepted  [] Declined   Yes No/NA Completed  [x]  []  Was there an ISR during remote phone call visit:  [] Yes [x]  No If yes, schedule unscheduled ISR assessment ASAP Date:   [x]  []  Assess for Protocol Deviations  [x]  []  Reminders:  Participants of childbearing potential agree to use a highly effective method of contraception consistently and correctly HIV vaccines are not permitted at any time during the study No other experimental agents, antiretroviral drugs, cytotoxic chemotherapy, or radiation therapy may not be administered throughout the trial No systemically administered immunomodulator's permitted Notify study staff if you experience any signs/symptoms, visit provider in ER or scheduled evaluation, or start any new OTC, supplemental or prescription therapies Instructed to contact site if ISR gets worse or does not improve after 10 days from the last ISR assessment.   Yes No Completed:  [x]  []  Compensation:  You will receive a payment of $78.00 at the end of each non dosing visit completed to reimburse you for your time and effort.  You will receive $125.00  at the end of each dosing visit completed to reimburse you for your time and effort.   If you must return to the clinic for  an unscheduled visit, you will receive $78.  Phone visits will be reimbursed at $30.00.  Amount: $30.00   [x]  []  Schedule Next Visit: Visits should be planned using a calendar months and scheduled based on the date of the Day 1 injection.    Remote Call Visits schedule on M7+2wk, M8+2wk, M12, M15, M19, M23, M27, M31 (Assess ISR, AES, Conmeds)  If transitioning to CAB 200mg /ml do not need LTFU visits, but do need ED visit and 4 week followup visit.  Date: 24NOV2025     Time: 0900   Version 3.0 LOA03 07Nov2025 JLS    Informed ppt that there is a new IFC that will need to be signed at the next visit.

## 2024-03-04 NOTE — Progress Notes (Signed)
 I, Constance Passer, MD, serving as Sub-Investigator for this study, have thoroughly reviewed the participant's medical chart, laboratory results, and all relevant study-related clinical information. This review was completed during the absence of the Principal Investigator, Jomarie Fleeta Rothman, MD, who is out of the country until Mar 12, 2024. All findings have been assessed for clinical significance and study relevance, and appropriate actions have been taken in accordance with the study protocol.

## 2024-03-10 NOTE — Progress Notes (Signed)
 03/11/2024 Alexis Hardin [id number] [birth year]  Referring provider: Liana Fish, NP Primary GI doctor: Dr. Suzann  ASSESSMENT AND PLAN:  Anemia 07/18/2023  HGB 12.5 MCV 81.9 Platelets 248 09/19/2022 Iron 31 Ferritin 8 B12 686 Recent Labs    04/29/23 1655 07/18/23 1038  HGB 13.3 12.5  Has menses, last 5 days, can be heavy at times and painful.  Schedule EGD and colonoscopy for evaluation If negative like poor absorption from sleeve and menorrhagia - check CBC, iron, ferritin, B12 - schedule EGD/colonoscopy. Risk of bowel prep, conscious sedation, and EGD and colonoscopy were discussed.  Risks include but are not limited to dehydration, pain, bleeding, cardiopulmonary process, bowel perforation, or other possible adverse outcomes..  Treatment plan was discussed with patient, and agreed upon.  GERD with small HH, history of gastric sleeve 2020 Has had weight gain, no weight loss On lansoprazole OTC can take up to 3 pills to help with the GERD - continue medication for GERD -Lifestyle changes discussed, avoid NSAIDS, ETOH, hand out given to the patient -Schedule EGD at [facility] to evaluate GERD, esophagitis, hiatal hernia,H pylori. I discussed risks of EGD with patient today, including risk of sedation, bleeding or perforation. Patient provides understanding and gave verbal consent to proceed.  Abdominal pain with bowel changes/constipation 07/18/2023 CTAP W no acute process, possible leiomyoma Recent diarrheal illness in September positive astrovirus Has added prunes/fiber - Increase fiber/ water intake, decrease caffeine, increase activity level. -Will get KUB - check celiac panel, ESR -Will add on Linzess 145 mcg -possible component of pelvis floor dysfunction with history and symptoms. Refer to pelvic floor PT - consider SIBO testing or empiric treatment, higher risk due to ABX use/surgery  History of gastric bypass Recent weight gain 20 lbs [facility] Pending EGD and degree of GERD, may want to consider revision with bypass  Hepatic Steatosis seen on imaging without LFT elevation seen on CTAP W3/29/25    Latest Ref Rng & Units 07/18/2023   10:38 AM 04/29/2023    4:55 PM 09/19/2022    8:05 AM  Hepatic Function  Total Protein 6.5 - 8.1 g/dL 7.4  7.9  7.0   Albumin 3.5 - 5.0 g/dL 3.9  4.2  4.3   AST 15 - 41 U/L 18  22  14    ALT 0 - 44 U/L 13  17  12    Alk Phosphatase 38 - 126 U/L 52  55  55   Total Bilirubin 0.0 - 1.2 mg/dL 0.6  0.4  0.4    Platelets 248  - need LFTs and CBC monitored every 6 months, - evaluation with imaging every 2-3 years.  - Encouraged diet/exercise for modest 10% body weight loss as treatment for hepatic steatosis -Continue to work on risk factor modification including diet exercise and control of risk factors including blood sugars.  Recurrent UTI and pelvic pain/leiomyoma Following with [facility] last seen 01/20/2024 Painful intercourse Pelvic floor physical therapy  Patient Care Team: Liana Fish, NP as PCP - General (Nurse Practitioner)  HISTORY OF PRESENT ILLNESS: 48 y.o. female with a past medical history listed below presents for evaluation of multiple GI complaints including GERD s/p gastric sleeve, constipation, anemia  Discussed the use of AI scribe software for clinical note transcription with the patient, who gave verbal consent to proceed.  History of Present Illness   Alexis Hardin Ximena is a 48 year old female with a history of gastric sleeve  surgery who presents with gastrointestinal symptoms and weight gain.  She experiences gastrointestinal symptoms including diarrhea, constipation, and abdominal pain. Her stools alternate between diarrhea and constipation, and she describes her gut as feeling 'really inflamed'. She has been consuming prunes and adding fiber to her diet for about two months to manage these symptoms, with prunes providing  some relief despite their high sugar content. No dark black stools or blood in stools, but she notes changes in stool color to pale.  She has been gaining weight over the past year despite not significantly changing her diet, with a weight gain of approximately 28 pounds. She denies a preference for candy and states that her portion sizes are not large. She is not currently taking any vitamins or supplements.  Three months ago, she experienced a severe stomachache, during which a virus was found, and she was noted to be anemic. She is not on any B12 or iron supplements. She reports having heavy menstrual periods that last about five days and are sometimes painful.  She experiences heartburn that comes and goes, for which she takes over-the-counter lansoprazole. She sometimes needs to take three pills for it to be effective. No trouble swallowing. She also reports a burning sensation after bowel movements.  She has a history of recurrent urinary tract infections, with four episodes in the past year. She reports symptoms consistent with pelvic floor dysfunction, including trouble urinating, frequent UTIs, and lower back pain.  No history of colon cancer or autoimmune diseases. She has a history of diabetes. She does not consume alcohol or take NSAIDs like Aleve  or ibuprofen. She reports a history of fatty liver noted on a CT scan in March 2025, but her liver function tests have been normal.      She  reports that she has never smoked. She has never been exposed to tobacco smoke. She has never used smokeless tobacco. She reports that she does not currently use alcohol after a past usage of about 1.0 standard drink of alcohol per week. She reports current drug use. Drug: Marijuana.  RELEVANT GI HISTORY, IMAGING AND LABS: Results   RADIOLOGY CT scan: Hepatic steatosis (07/18/2023)      CBC    Component Value Date/Time   WBC 4.9 07/18/2023 1038   RBC 4.74 07/18/2023 1038   HGB 12.5 07/18/2023  1038   HGB 12.4 09/19/2022 0805   HCT 38.8 07/18/2023 1038   HCT 40.3 09/19/2022 0805   PLT 248 07/18/2023 1038   PLT 231 09/19/2022 0805   MCV 81.9 07/18/2023 1038   MCV 79 09/19/2022 0805   MCH 26.4 07/18/2023 1038   MCHC 32.2 07/18/2023 1038   RDW 14.1 07/18/2023 1038   RDW 14.5 09/19/2022 0805   LYMPHSABS 1.5 07/18/2023 1038   LYMPHSABS 1.9 09/19/2022 0805   MONOABS 0.5 07/18/2023 1038   EOSABS 0.1 07/18/2023 1038   EOSABS 0.1 09/19/2022 0805   BASOSABS 0.1 07/18/2023 1038   BASOSABS 0.1 09/19/2022 0805   Recent Labs    04/29/23 1655 07/18/23 1038  HGB 13.3 12.5    CMP     Component Value Date/Time   NA 138 07/18/2023 1038   NA 138 09/19/2022 0805   K 3.6 07/18/2023 1038   CL 107 07/18/2023 1038   CO2 23 07/18/2023 1038   GLUCOSE 82 07/18/2023 1038   BUN 16 07/18/2023 1038   BUN 15 09/19/2022 0805   CREATININE 0.77 07/18/2023 1038   CALCIUM 9.3 07/18/2023 1038   PROT 7.4  07/18/2023 1038   PROT 7.0 09/19/2022 0805   ALBUMIN 3.9 07/18/2023 1038   ALBUMIN 4.3 09/19/2022 0805   AST 18 07/18/2023 1038   ALT 13 07/18/2023 1038   ALKPHOS 52 07/18/2023 1038   BILITOT 0.6 07/18/2023 1038   BILITOT 0.4 09/19/2022 0805   GFRNONAA >60 07/18/2023 1038      Latest Ref Rng & Units 07/18/2023   10:38 AM 04/29/2023    4:55 PM 09/19/2022    8:05 AM  Hepatic Function  Total Protein 6.5 - 8.1 g/dL 7.4  7.9  7.0   Albumin 3.5 - 5.0 g/dL 3.9  4.2  4.3   AST 15 - 41 U/L 18  22  14    ALT 0 - 44 U/L 13  17  12    Alk Phosphatase 38 - 126 U/L 52  55  55   Total Bilirubin 0.0 - 1.2 mg/dL 0.6  0.4  0.4       Current Medications:             Current Outpatient Medications (Other):    famotidine  (PEPCID ) 20 MG tablet, Take 1 tablet (20 mg total) by mouth 2 (two) times daily for 14 days.   Na Sulfate-K Sulfate-Mg Sulfate concentrate (SUPREP) 17.5-3.13-1.6 GM/177ML SOLN, Take 1 kit (354 mLs total) by mouth once for 1 dose.   nitrofurantoin , macrocrystal-monohydrate,  (MACROBID ) 100 MG capsule, Take 1 capsule (100 mg total) by mouth 2 (two) times daily.   Magnesium Glycinate 100 MG CAPS, Take 50 mg by mouth daily. (Patient not taking: Reported on 03/11/2024)   Nicotinamide Riboside Chloride POWD, 1 capsule by Does not apply route daily. (Patient not taking: Reported on 03/11/2024)   phenazopyridine  (PYRIDIUM ) 200 MG tablet, Take 1 tablet (200 mg total) by mouth 3 (three) times daily as needed for pain. (Patient not taking: Reported on 03/11/2024)   RESVERATROL PO, Take 1 capsule by mouth daily. (Patient not taking: Reported on 03/11/2024)  Current Facility-Administered Medications (Other):    Study - EXTEND - cabotegravir  ULA ([id number]) 1600 mg/3 mL intramuscular injection ([facility])  Medical History:  Past Medical History:  Diagnosis Date   Anemia 10/06/2022   Bradycardia 2025   Asymptomatic   Conjunctivitis 10/26/2021   of the left eye, resolved 17Jul2023   Intermittent Low back pain 11/23/2020   Irritable bowel syndrome (IBS) 2023   Urinary tract infection 05/02/2022   resolved 17Jan2024   Allergies: No Known Allergies   Surgical History:  She  has a past surgical history that includes Refractive surgery (2006); Cosmetic surgery (2020); Cholecystectomy (2011); and Cesarean section (2011). Family History:  Her family history is not on file.  REVIEW OF SYSTEMS  : All other systems reviewed and negative except where noted in the History of Present Illness.  PHYSICAL EXAM: BP 104/68 (BP Location: Left Arm, Patient Position: Sitting, Cuff Size: Normal)   Pulse 77   Ht 5' (1.524 m)   Wt 148 lb 6 oz (67.3 kg)   BMI 28.98 kg/m  Physical Exam   GENERAL APPEARANCE: Well nourished, in no apparent distress. HEENT: No cervical lymphadenopathy, unremarkable thyroid, sclerae anicteric, conjunctiva pink. RESPIRATORY: Respiratory effort normal, breath sounds equal bilaterally without rales, rhonchi, or wheezing. CARDIO: Regular rate and rhythm with  no murmurs, rubs, or gallops, peripheral pulses intact. ABDOMEN: Soft, non-distended, active bowel sounds in all four quadrants, mild tenderness in left lower quadrant, no rebound, no mass appreciated. RECTAL: Declines. MUSCULOSKELETAL: Full range of motion, normal gait, without edema. SKIN:  Dry, intact without rashes or lesions. No jaundice. NEURO: Alert, oriented, no focal deficits. PSYCH: Cooperative, normal mood and affect.      Alan JONELLE Coombs, PA-C 11:50 AM

## 2024-03-11 ENCOUNTER — Ambulatory Visit: Payer: Self-pay | Admitting: Physician Assistant

## 2024-03-11 ENCOUNTER — Ambulatory Visit (INDEPENDENT_AMBULATORY_CARE_PROVIDER_SITE_OTHER)
Admission: RE | Admit: 2024-03-11 | Discharge: 2024-03-11 | Disposition: A | Discharge status: Home / Self Care | Source: Ambulatory Visit | Attending: Physician Assistant | Admitting: Physician Assistant

## 2024-03-11 ENCOUNTER — Encounter: Payer: Self-pay | Admitting: Physician Assistant

## 2024-03-11 ENCOUNTER — Other Ambulatory Visit

## 2024-03-11 VITALS — BP 104/68 | HR 77 | Ht 60.0 in | Wt 148.4 lb

## 2024-03-11 DIAGNOSIS — M6289 Other specified disorders of muscle: Secondary | ICD-10-CM | POA: Diagnosis not present

## 2024-03-11 DIAGNOSIS — K5904 Chronic idiopathic constipation: Secondary | ICD-10-CM | POA: Diagnosis not present

## 2024-03-11 DIAGNOSIS — K219 Gastro-esophageal reflux disease without esophagitis: Secondary | ICD-10-CM | POA: Diagnosis not present

## 2024-03-11 DIAGNOSIS — D509 Iron deficiency anemia, unspecified: Secondary | ICD-10-CM

## 2024-03-11 DIAGNOSIS — Z903 Acquired absence of stomach [part of]: Secondary | ICD-10-CM

## 2024-03-11 DIAGNOSIS — N390 Urinary tract infection, site not specified: Secondary | ICD-10-CM

## 2024-03-11 DIAGNOSIS — K760 Fatty (change of) liver, not elsewhere classified: Secondary | ICD-10-CM

## 2024-03-11 LAB — CBC WITH DIFFERENTIAL/PLATELET
Basophils Absolute: 0.1 K/uL (ref 0.0–0.1)
Basophils Relative: 0.7 % (ref 0.0–3.0)
Eosinophils Absolute: 0.2 K/uL (ref 0.0–0.7)
Eosinophils Relative: 2.8 % (ref 0.0–5.0)
HCT: 31.8 % — ABNORMAL LOW (ref 36.0–46.0)
Hemoglobin: 10.2 g/dL — ABNORMAL LOW (ref 12.0–15.0)
Lymphocytes Relative: 33.6 % (ref 12.0–46.0)
Lymphs Abs: 2.6 K/uL (ref 0.7–4.0)
MCHC: 31.9 g/dL (ref 30.0–36.0)
MCV: 69.2 fl — ABNORMAL LOW (ref 78.0–100.0)
Monocytes Absolute: 0.6 K/uL (ref 0.1–1.0)
Monocytes Relative: 8.2 % (ref 3.0–12.0)
Neutro Abs: 4.2 K/uL (ref 1.4–7.7)
Neutrophils Relative %: 54.7 % (ref 43.0–77.0)
Platelets: 317 K/uL (ref 150.0–400.0)
RBC: 4.59 Mil/uL (ref 3.87–5.11)
RDW: 16.7 % — ABNORMAL HIGH (ref 11.5–15.5)
WBC: 7.6 K/uL (ref 4.0–10.5)

## 2024-03-11 LAB — COMPREHENSIVE METABOLIC PANEL WITH GFR
ALT: 15 U/L (ref 0–35)
AST: 16 U/L (ref 0–37)
Albumin: 4.1 g/dL (ref 3.5–5.2)
Alkaline Phosphatase: 63 U/L (ref 39–117)
BUN: 17 mg/dL (ref 6–23)
CO2: 27 meq/L (ref 19–32)
Calcium: 8.7 mg/dL (ref 8.4–10.5)
Chloride: 105 meq/L (ref 96–112)
Creatinine, Ser: 0.7 mg/dL (ref 0.40–1.20)
GFR: 103.44 mL/min (ref >60.00)
Glucose, Bld: 84 mg/dL (ref 70–99)
Potassium: 3.9 meq/L (ref 3.5–5.1)
Sodium: 139 meq/L (ref 135–145)
Total Bilirubin: 0.2 mg/dL (ref 0.2–1.2)
Total Protein: 7.3 g/dL (ref 6.0–8.3)

## 2024-03-11 LAB — IBC + FERRITIN
Ferritin: 2.8 ng/mL — ABNORMAL LOW (ref 10.0–291.0)
Iron: 12 ug/dL — ABNORMAL LOW (ref 42–145)
Saturation Ratios: 2.6 % — ABNORMAL LOW (ref 20.0–50.0)
TIBC: 464.8 ug/dL — ABNORMAL HIGH (ref 250.0–450.0)
Transferrin: 332 mg/dL (ref 212.0–360.0)

## 2024-03-11 LAB — SEDIMENTATION RATE: Sed Rate: 24 mm/h — ABNORMAL HIGH (ref 0–20)

## 2024-03-11 LAB — VITAMIN B12: Vitamin B-12: 612 pg/mL (ref 211–911)

## 2024-03-11 MED ORDER — NA SULFATE-K SULFATE-MG SULF 17.5-3.13-1.6 GM/177ML PO SOLN: 1.0000 | Freq: Once | ORAL | 0 refills | Status: AC

## 2024-03-11 NOTE — Patient Instructions (Addendum)
 Your provider has requested that you go to the basement level for lab work before leaving today. Press B on the elevator. The lab is located at the first door on the left as you exit the elevator.  Your provider has requested that you have an abdominal x ray before leaving today. Please go to the basement floor to our Radiology department for the test.  We have sent the following medications to your pharmacy for you to pick up at your convenience: Suprep  We have given you samples of the following medication to take: Linzess 145mcg   Please call in 3 weeks if you haven't heard anything regarding a referral to [facility]  Avoid spicy and acidic foods Avoid fatty foods Limit your intake of coffee, tea, alcohol, and carbonated drinks Work to maintain a healthy weight Keep the head of the bed elevated at least 3 inches with blocks or a wedge pillow if you are having any nighttime symptoms Stay upright for 2 hours after eating Avoid meals and snacks three to four hours before bedtime  Linzess 145 mcg *IBS-C patients may begin to experience relief from belly pain and overall abdominal symptoms (pain, discomfort, and bloating) in about 1 week,  with symptoms typically improving over 12 weeks.  Take at least 30 minutes before the first meal of the day on an empty stomach You can have a loose stool if you eat a high-fat breakfast. Give it at least 7 days, may have more bowel movements during that time.   The diarrhea should go away and you should start having normal, complete, full bowel movements.  It may be helpful to start treatment when you can be near the comfort of your own bathroom, such as a weekend.  After you are out we can send in a prescription if you did well, there is a prescription card   Toileting tips to help with your constipation - Drink at least 64-80 ounces of water/liquid per day. - Establish a time to try to move your bowels every day.  For many  people, this is after a cup of coffee or after a meal such as breakfast. - Sit all of the way back on the toilet keeping your back fairly straight and while sitting up, try to rest the tops of your forearms on your upper thighs.   - Raising your feet with a step stool/squatty potty can be helpful to improve the angle that allows your stool to pass through the rectum. - Relax the rectum feeling it bulge toward the toilet water.  If you feel your rectum raising toward your body, you are contracting rather than relaxing. - Breathe in and slowly exhale. Belly breath by expanding your belly towards your belly button. Keep belly expanded as you gently direct pressure down and back to the anus.  A low pitched GRRR sound can assist with increasing intra-abdominal pressure.  (Can also trying to blow on a pinwheel and make it move, this helps with the same belly breathing) - Repeat 3-4 times. If unsuccessful, contract the pelvic floor to restore normal tone and get off the toilet.  Avoid excessive straining. - To reduce excessive wiping by teaching your anus to normally contract, place hands on outer aspect of knees and resist knee movement outward.  Hold 5-10 second then place hands just inside of knees and resist inward movement of knees.  Hold 5 seconds.  Repeat a few times each way.  Go to the ER if  unable to pass gas, severe AB pain, unable to hold down food, any shortness of breath of chest pain.     Small intestinal bacterial overgrowth (SIBO) occurs when there is an abnormal increase in the overall bacterial population in the small intestine -- particularly types of bacteria not commonly found in that part of the digestive tract. Small intestinal bacterial overgrowth (SIBO) commonly results when a circumstance -- such as surgery or disease -- slows the passage of food and waste products in the digestive tract, creating a breeding ground for bacteria.  Signs and symptoms of SIBO often include: Loss  of appetite Abdominal pain Nausea Bloating An uncomfortable feeling of fullness after eating Diarrhea or constipation, depending on the type of gas produced  What foods trigger SIBO? While foods aren't the original cause of SIBO, certain foods do encourage the overgrowth of the wrong bacteria in your small intestine. If you're feeding them their favorite foods, they're going to grow more, and that will trigger more of your SIBO symptoms. By the same token, you can help reduce the overgrowth by starving the problematic bacteria of their favorite foods. This strategy has led to a number of proposed SIBO eating plans. The plans vary, and so do individual results. But in general, they tend to recommend limiting carbohydrates.  These include: Sugars and sweeteners. Fruits and starchy vegetables. Dairy products. Grains.  There is a test for this we can do called a breath test, if you are positive we will treat you with an antibiotic to see if it helps.  Your symptoms are very suspicious for this condition, as discussed, we will start you on an antibiotic to see if this helps.    Here some information about pelvic floor dysfunction. This may be contributing to some of your symptoms. We will continue with our evaluation but I do want you to consider adding on fiber supplement with low-dose MiraLAX daily. We could also refer to pelvic floor physical therapy.   Pelvic Floor Dysfunction, Female Pelvic floor dysfunction (PFD) is a condition that results when the group of muscles and connective tissues that support the organs in the pelvis (pelvic floor muscles) do not work well. These muscles and their connections form a sling that supports the colon and bladder. In women, they also support the uterus. PFD causes pelvic floor muscles to be too weak, too tight, or both. In PFD, muscle movements are not coordinated. This may cause bowel or bladder problems. It may also cause pain. What are the  causes? This condition may be caused by an injury to the pelvic area or by a weakening of pelvic muscles. This often results from pregnancy and childbirth or other types of strain. In many cases, the exact cause is not known. What increases the risk? The following factors may make you more likely to develop this condition: Having chronic bladder tissue inflammation (interstitial cystitis). Being an older person. Being overweight. History of radiation treatment for cancer in the pelvic region. Previous pelvic surgery, such as removal of the uterus (hysterectomy). What are the signs or symptoms? Symptoms of this condition vary and may include: Bladder symptoms, such as: Trouble starting urination and emptying the bladder. Frequent urinary tract infections. Leaking urine when coughing, laughing, or exercising (stress incontinence). Having to pass urine urgently or frequently. Pain when passing urine. Bowel symptoms, such as: Constipation. Urgent or frequent bowel movements. Incomplete bowel movements. Painful bowel movements. Leaking stool or gas. Unexplained genital or rectal pain. Genital or rectal muscle  spasms. Low back pain. Other symptoms may include: A heavy, full, or aching feeling in the vagina. A bulge that protrudes into the vagina. Pain during or after sex. How is this diagnosed? This condition may be diagnosed based on: Your symptoms and medical history. A physical exam. During the exam, your health care provider may check your pelvic muscles for tightness, spasm, pain, or weakness. This may include a rectal exam and a pelvic exam. In some cases, you may have diagnostic tests, such as: Electrical muscle function tests. Urine flow testing. X-ray tests of bowel function. Ultrasound of the pelvic organs. How is this treated? Treatment for this condition depends on the symptoms. Treatment options include: Physical therapy. This may include Kegel exercises to help relax  or strengthen the pelvic floor muscles. Biofeedback. This type of therapy provides feedback on how tight your pelvic floor muscles are so that you can learn to control them. Internal or external massage therapy. A treatment that involves electrical stimulation of the pelvic floor muscles to help control pain (transcutaneous electrical nerve stimulation, or TENS). Sound wave therapy (ultrasound) to reduce muscle spasms. Medicines, such as: Muscle relaxants. Bladder control medicines. Surgery to reconstruct or support pelvic floor muscles may be an option if other treatments do not help. Follow these instructions at home: Activity Do your usual activities as told by your health care provider. Ask your health care provider if you should modify any activities. Do pelvic floor strengthening or relaxing exercises at home as told by your physical therapist. Lifestyle Maintain a healthy weight. Eat foods that are high in fiber, such as beans, whole grains, and fresh fruits and vegetables. Limit foods that are high in fat and processed sugars, such as fried or sweet foods. Manage stress with relaxation techniques such as yoga or meditation. General instructions If you have problems with leakage: Use absorbable pads or wear padded underwear. Wash frequently with mild soap. Keep your genital and anal area as clean and dry as possible. Ask your health care provider if you should try a barrier cream to prevent skin irritation. Take warm baths to relieve pelvic muscle tension or spasms. Take over-the-counter and prescription medicines only as told by your health care provider. Keep all follow-up visits. How is this prevented? The cause of PFD is not always known, but there are a few things you can do to reduce the risk of developing this condition, including: Staying at a healthy weight. Getting regular exercise. Managing stress. Contact a health care provider if: Your symptoms are not improving  with home care. You have signs or symptoms of PFD that get worse at home. You develop new signs or symptoms. You have signs of a urinary tract infection, such as: Fever. Chills. Increased urinary frequency. A burning feeling when urinating. You have not had a bowel movement in 3 days (constipation). Summary Pelvic floor dysfunction results when the muscles and connective tissues in your pelvic floor do not work well. These muscles and their connections form a sling that supports your colon and bladder. In women, they also support the uterus. PFD may be caused by an injury to the pelvic area or by a weakening of pelvic muscles. PFD causes pelvic floor muscles to be too weak, too tight, or a combination of both. Symptoms may vary from person to person. In most cases, PFD can be treated with physical therapies and medicines. Surgery may be an option if other treatments do not help. This information is not intended to replace advice  given to you by your health care provider. Make sure you discuss any questions you have with your health care provider. Document Revised: 08/15/2020 Document Reviewed: 08/15/2020 Elsevier Patient Education  2022 [employer].

## 2024-03-12 LAB — TISSUE TRANSGLUTAMINASE, IGA: (tTG) Ab, IgA: <1 U/mL

## 2024-03-12 LAB — IGA: Immunoglobulin A: 187 mg/dL (ref 47–310)

## 2024-03-14 ENCOUNTER — Other Ambulatory Visit: Payer: Self-pay

## 2024-03-14 VITALS — BP 109/73 | HR 69 | Ht 60.0 in | Wt 147.3 lb

## 2024-03-14 DIAGNOSIS — Z006 Encounter for examination for normal comparison and control in clinical research program: Secondary | ICD-10-CM

## 2024-03-14 MED ORDER — STUDY - EXTEND - CABOTEGRAVIR ULA (GSK1265744) 1600 MG IM INJECTION (PI-VAN DAM)
1600.0000 mg | INJECTION | INTRAMUSCULAR | Status: AC
  Administered 2024-03-14: 1600 mg via INTRAMUSCULAR
  Filled 2024-03-14: qty 3

## 2024-03-14 NOTE — Research (Signed)
 [facility]ERT[facility] [id number]                                                  Protocol Amendment 03  [facility]:  [phone]                                                                  TREATMENT PHASE   Visit:  [] M7+2W     [] M8    [] M8+2W   [x] M9    [] M9+1W   [] M10    [] M11    [] M12    [] M13   [] M13+1W    [] M15    [] M17    [] M17+1W     [] M19    [] M21   [] M23    [] M25   [] M27    [] M29    [] M31   [] M33   [] ED    [] 4 wk followup (if not entering LTFU)   [] LTFU M1   [] LTFU M4    [] LTFU M8    [] LTFU M12     Date: 24Nov2025         4 Letter Code: MELO Subject ID: [id number]  Assessments should occur in the following order:  eCSSRs  All other questionnaires  Triplicate 12-lead ECG  Vital signs  Blood draws (clinical laboratory tests, pre-dose pharmacokinetics, etc)  IP injection  Post-dose pharmacokinetics (if required)  Yes No Completed:  [x]  []  Verify participant identity before doing any study procedures   All Visits  [x]  []  Verify correct version of ICF is signed   All Visits   Yes No Questionnaires Completed: Patient Reported Outcome Assessment must be completed before other assessments take place at each designated visit. Completed electronically by participants.  [x]  []  CSSRs    Visits: M9, M13, M17, M21, M25, M29, M33, ED Any positive (abnormal) response indicating SIB or any unusual changes in behavior, confirmed by the investigator will result in their discontinuation of study intervention, the PI/SI will arrange for urgent specialist psychiatric evaluation and management  []  [x]  Social Determinants of Health Questionnaire (SDoH)     Visits: M17, ED  [x]  []  Study Medication Satisfaction Questionnaire (SMSQs)     Visits: M9, M13, M17, M21, M25, M29, M33, ED  []  [x]  PrEP Preference and Rationale Questionnaire (only if prior exposure to oral PrEP)     Visits: M9, M17, M25, M33, ED  [x]  []  Acceptability of Injection Questionnaire     Visits: M9, M13, M17, M21, M25, M29, M33, ED  [x]  []  Generalized  Anxiety Disorders (GAD-&) Questionnaire     Visits: M9, M13, M17, M21, M25, M29, M33, ED  []  [x]  HIV-risk and PrEP use Anxiety and Stigma Questionnaire prior to the study drug administration     Visits: M17, M29, M33, ED  [x]  []  Sexual Health Practices Questionnaire-administer prior to study drug administration Visits: M9, M13, M17, M21, M25, M29, M33, ED    Yes No Completed:  [x]  []  Review Changes in Concomitant Medications    All Visits  [x]  []  Assess for AEs, SAEs: All AEs (from Day 1 onwards) and SAEs (from Screening onwards) must be recorded in  eCRF at all visits and reported within required timelines.    All Visits  Is participant experiencing any symptomatic STIs currently?  [] Yes [x] No (if yes, perform STI testing)  Is participant experiencing any signs or symptoms consistent with acute HIV infection?  [] Yes [x] No (if yes, perform HIV testing)   [x]  []  Pull Review in:  Medication Review Medication Indication Dose/Route/Frequency Start Date End Date  Magnesium Glycinate Prophylaxis 50mg  PO QD 01Dec2024 31MAR2025  Nicotinamide Riboside Chloride Powder Prophylaxis 1 Capsule QD 01Dec2024 31MAR2025  Resveratrol Prophylaxis 1 Capsule QD 01Dec2024 31MAR2025  Cephalexin Urinary Tract Infection 500 mg tab QD 06Mar2025 11Mar2025  Amoxicillin   Urinary Tract Infection 875 mg tab BID 12Mar2025 22Mar2025  phenazopyridine  (PYRIDIUM )  Urinary Tract Infection 200 MG tablet TID 12Mar2025 17Mar2025  phenazopyridine  (PYRIDIUM )  Urinary Tract Infection 200 MG tablet TID 28mar2025 09APR2025  Ciprofloxacin  (Cipro ) Urinary tract infection 500 mg PO BID 28MAR2025 02APR2025  nitrofurantoin , macrocrystal-monohydrate, (MACROBID )  Urinary Tract Infection 100 mg PO BID  02APR2025 09APR2025  famotidine  (PEPCID )  nausea 20 mg tab PO BID 29MAR2025 09APR2025  iohexol  (OMNIPAQUE ) 9 MG/ML Oral contrast Abdominal pain  oral solution 500 mL once 29MAR2025 29MAR2025  alum & mag hydroxide-simeth (MAALOX/MYLANTA)   200-200-20 MG/5ML  nausea Oral suspension 30 mL once 29MAR2025 29MAR2025  lidocaine  (XYLOCAINE ) 2 %  nausea 15 mL PO once 29MAR2025 29MAR2025  lactated ringers  bolus 1,000 mL  lightheadedness 1,000 mL Intravenous once 29MAR2025 29MAR2025  ondansetron  (ZOFRAN )   nausea 4 mg IV once  29MAR2025 29MAR2025  ketorolac  (TORADOL )  Abdominal pain 15 mg Intravenous once 29MAR2025 29MAR2025  iohexol  (OMNIPAQUE ) 300 MG/ML Intravenous Contrast  Abdominal pain  80 mL IV once 29MAR2025 29MAR2025  ibuprofen Right ventrogluteal injection site tenderness 200mg  tab PO Q8 hours 02JUN2025 09JUN2025  metroNIDAZOLE (FLAGYL)  diarrhea 500mg  tab PO TID 04SEP2025 14SEP2025  Iron anemia 65mg  tab PO QD 04SEP2025 06SEP2025  Vitamin C anemia 125mg  tab PO QD 04SEP2025 06SEP2025  metroNIDAZOLE (FLAGYL)  diarrhea 500mg  tab PO TID 26SEP2025 06OCT2025   AE Review Diagnosis/Symptoms Indicate: NSAE SAE AESI Maximum Grade Related/Not Related to study intervention CAB LA or CAB ULA Action/Dose Change Start Date (Time, if available) End Date (Time, if available)  Right Ventrogluteal Injection Site Tenderness NSAE  1 Related to CAB LA No Change, No action taken 28Feb2025 02Mar2025  Urinary Tract Infection NSAE 2 Not Related to study intervention No change, no action taken 04Mar2025 22Mar2025  Urinary tract infection NSAE 2 Not related to study intervention No change, no action taken 28MAR2025 09APR2025  Abdominal pain NSAE 2 Not related to study intervention No change, no action taken 29MAR2025 09APR2025  Nausea NSAE 2 Not related to study intervention No change, no action taken 29MAR2025 09APR2025  Vomiting NSAE 2 Not related to study intervention No change, no action taken 29MAR2025 09APR2025  Lightheadedness NSAE 2 Not related to study intervention No change, no action taken 29MAR2025 30MAR2025  Hiatal hernia NSAE 2 Not related to study intervention No change, no action taken 29MAR2025   Uterine leiomyoma, unspecified  location  NSAE 2 Not related to study intervention No change, no action taken 29MAR2025   Right ventrogluteal injection site tenderness NSAE 1 Related to CAB ULA No change, no action 02JUN2025 09JUN2025  Viral Diarrhea NSAE 2 Not related to study intervention No change, no action 01SEP2025 05SEP2025  Viral Diarrhea NSAE 2 Not related to study intervention No change, No action 24SEP2025 06OCT2025  Increased creatinine , grade 3 NSAE 3 Not related to Study intervention No  change, no action 01OCT2025    Medical History Review  Diagnosis Grade Start Date End Date  Bradycardia, asymptomatic 1 Unk 2025   Irritable Bowel Syndrome 2 Unk 2023   Intermittent Low Back Pain 2 05Aug2022   Conjunctivitis of Left Eye 2 08Jul2023 17Jul2023  Urinary Tract Infection 2 12Jan2024 17Jan2024  Anemia 2 17Jun2024       [x]  []  Contraception Review: Report in eCRF Visits: M8, M9, M10, M11, M13, M17, M21, M25, M29, M33, ED, 4WFU, LTFU Visits Female of childbearing potential: Must agree to use a highly effective method of contraception consistently and correctly, must also continue to use adequate contraception methods for at least 52 weeks after the last injection. Female condoms must be used in addition to hormonal contraception Sexual Abstinence: considered a highly effective method only if defined as refraining from heterosexual intercourse during the entire period of risk associated with the study intervention.  The reliability of sexual abstinence needs to be evaluated in relation to the duration of the study and the preferred and usual lifestyle of the participant.  Partner with Vasectomy.   Yes No/NA Completed:  []  [x]  Review Changes in Medical History:   Visits: M13, M21, M29, M33  Premature cardiovascular disease: female participant <65 years or female participant <55 years in first degree relatives only.  Note changes since the start of the study.    [] No Changes    Substance Usage: (list substance, amount  used, start and stop dates)  Social History   Substance and Sexual Activity  Alcohol Use Not Currently   Alcohol/week: 1.0 standard drink of alcohol   Types: 1 Glasses of wine per week    Social History   Substance and Sexual Activity  Drug Use Yes   Types: Marijuana   Comment: + on urine screen    Tobacco Use: Low Risk  (03/11/2024)   Patient History    Smoking Tobacco Use: Never    Smokeless Tobacco Use: Never    Passive Exposure: Never    Caffeine Use:  caffeine containing beverages/day  [x]  []  Triplicate 12-lead ECG     Visits: M9, M29, ED Perform in a semi-supine position after 5 minutes of rest.  3 individual ECG tracing should be obtained as closely as possible in succession, but no more than 2 minutes apart. The full set of triplicates should be completed in less than 6 minutes. If ECG abnormal clinically significant  then report as an AE/SAE.  Rest Start Time: 0955 ECG #1 start time: 1003    ECG #2: 1004    ECG #3: 1005   [x]  []  Vital Signs:  [x] Weight, BMI (measured in a semi supine position after 5 minutes rest)  Visits:  M9, M13, M17, M21, M25, M29, M33, ED, 4WFU, LTFU Visits  [x] Height: 60 in.    Visit: M9  [x] BP and Pulse (measured in a semi supine position after 5 minutes rest)  Visits: M9, M13, M17, M21, M25, M29, M33, ED, 4WFU,  LTFU Visits  [] Temperature and RR:    Visits: M13, ED  Rest Start Time: 1015  Vital Signs Start Time: 1021  Last Weight  Most recent update: 03/14/2024 10:36 AM    Weight  66.8 kg (147 lb 4.3 oz)             Body mass index is 28.76 kg/m.  Vitals:   03/14/24 1021  BP: 109/73  Pulse: 69     [x]  []  Brief Physical Examination: Will include at a minimum,  assessments of skin, lungs, cardiovascular system, abdomen (liver and spleen). Perform the symptom directed exam when there are symptoms present. In addition to the brief physical exam. Any abnormalities must be noted in the CRF (e.g current medical conditions or AE logs).      Visit: M8, M9, M11, M13, M17, M21, M25, M29, M33, ED, 4WFU, LTFU   Physical Exam Constitutional:      Appearance: Normal appearance.  Cardiovascular:     Rate and Rhythm: Normal rate.  Pulmonary:     Effort: Pulmonary effort is normal.     Breath sounds: Normal breath sounds.  Abdominal:     Palpations: Abdomen is soft.     Tenderness: There is no abdominal tenderness.  Skin:    General: Skin is warm.  Neurological:     Mental Status: She is alert and oriented to person, place, and time. Mental status is at baseline.  Psychiatric:        Mood and Affect: Mood normal.        Behavior: Behavior normal.   Denies any new medical concerns, no worsening of any preexisting issues.    Yes No Blood Draw Tracking:  All Visits  [x]  []  Verified previous Hgb to be >= 10 (if value <10, adjust volume per unit SOP)                          [] NA, healthy volunteer, previous Hgb results are unavailable  []  [x]  Assess Fasting Status.   Visits: M13, M33                                                      Is participant Fasting for at least 6 hours, overnight fast preferred?  []  No   []  Yes, Date/Time of last meal?   [x]  []  Blood Drawn.     Problems?  [x]  No   [] Yes, why?   Time: 1049    Total Vol: 38 mL   Yes No Lab Collection:  [x]  []  RAPID HIV TESTING        Visits: M9, M13, M17, M21, M25, M29, ED, 4WFU, LTFU  [] REACTIVE or [x] NON REACTIVE  If positive/reactive participant must be withdrawn from the study even if subsequent confirmatory testing is negative. If reactive participant referred for further testing and clinical management as per local standard of care   [x]  []  Chemistry     Visits: M8, M9, M10, M11, M13, M17, M21, M25, M29, M33, ED, 4WFU, LTFU  [x]  []  Hematology     Visits:  M8, M9, M10, M11, M13, M17, M21, M25, M29, M33, ED, 4WFU, LTFU  [x]  []  Cystatin C     Visits:  M8, M9, M10, M11, M13, M17, M21, M25, M29, M33, ED, 4WFU, LTFU  [x]  []  Non rapid HIV immunoassay    Visits: M9,  M13, M17, M21, M25, M29, M33, ED, 4WFU, LTFU  [x]  []  HIV-1 RNA     Visits: M9, M13, M17, M21, M25, M29, M33, ED, 4WFU, LTFU  [x]  []  Neisseria gonorrhea (GC), Chlamydia trachomatis (CT), Trichomononas vaginalis (TV), syphilis.  If positive we will refer for treatment as per local guidelines.   Visit: M9, M13, M17, M21, M25, M29, M33, ED, 4WFU, LTFU, PRN   **PRN if symptomatic at other visits** Appropriate swabs to be  collected based on reported sexual history.  [x] GC/CT NAAT urine/vaginal swab [x] GC and CT: Rectal Swab [x] Oropharyngeal Swab [x] TV: vaginal Swab   *If female and menstruating may take sample at next visit  []  [x]  Coagulation Tests     Visits: M13, M33, ED  []  [x]  Hepatitis B&C     Visits: M13, M25, ED  []  [x]  Fasting Labs: Glucose     Visits: M13, M33, ED (Insulin, HbA1c only collect if withdrawal visit occurs at M13 or M33)  [x]  []  Plasma for Storage     Visits: M8, M9, M10, M11, M13, M17, M21, M25, M29, ED  [x]  []  Predose: PK Sampling  Collect  < 1 hr before injection Visits: M9, M13, M17, M21, M25, M29  []  [x]  Illicit Drug Screen     Visits: M13, M21, M29, M33  []  [x]  Urinalysis: Morning specimen is preferred     Visits: M13, M33, ED, 4WFU   Time of Collection:   LMP: Patient's last menstrual period was 02/08/2024.  []  N/A   Yes No/NA Lab Collection, Cont.:  [x]  []  Urine Pregnancy Test (POCBP only)  Visits: M9, M13, M17, M21, M25, M29, M33 , ED, 4WFU, LTFU       Note: Must be resulted prior to receipt of IP  Time of Collection: 1011    [] REACTIVE or [x] NON REACTIVE  [] NA, assigned Female at birth or Not a POCBP   []                          [x]  Serum Pregnancy Test ([facility] only) if urine test positive, perform a serum test to confirm.   Yes No/NA Study Drug Administration  [x]  []  CAB ULA Administration:   Visit: M9, M13, M17, M21, M25, M29  Sites should be rotated between right and left Needle Size: [x] 1.5  [] 2 (For BMI >/= 30)   Body mass index is 28.76 kg/m.   Gauge: [x] 21G  Site: [x] Left Ventrogluteal  [] Right Ventrogluteal  Time of Administration: 1117   [x]  []  ISR Review completed, ISR photography necessary [] Yes[x] No     All Visits *Digital photographs will be documented at all visits (scheduled or unscheduled) on all participants who have an injection site reaction that is a visible, persistent Grade 2 (no improvement/resolution in >10 days), Grade >/=3, or serious. *Examination will include a physician/HCP assessment of pain (or tenderness), pruritus, warmth, infections, rash, erythema (or redness), swelling (or induration), and nodules (granulomas or cysts) *For injection site nodules, the largest diameter will be recorded in the eCRF at each ISR assessment for nodules of at least Grade 1 severity  *Remote Visits: M7+2W, M8+2W, M9+1W, M12, M15, M19, M23, M27, M31 should assess whether participants have any new ISR symptoms/signs since their last visit or have previously reported ISR that has not improved or is worsening, if present site should perform an unscheduled visits ASAP for ISR assessment   Yes No/NA  Pharmacokinetic Sampling  [x]  []   Post Dose PK sampling:  Visits: M8, M9, M9+1Wk, M10, M11, M13, M13+1, M17+1, M33, ED, 4WFU, LTFU   On dosing days collect PK 2 hours after injection (+/- 30 mins collection window)  Time of PK Collection: 1252    Yes No Completed:  [x]  []   HIV/STI  Counseling/Condom Distribution:    All Visits, except remote calls Condoms: [] Accepted  [x] Declined   Yes No/NA Completed  []  [x]  Was there an ISR during remote phone call visit:  [] Yes []  No If yes,  schedule unscheduled ISR assessment ASAP Date:   [x]  []  Assess for Protocol Deviations  [x]  []  Reminders:  Participants of childbearing potential agree to use a highly effective method of contraception consistently and correctly HIV vaccines are not permitted at any time during the study No other experimental agents, antiretroviral drugs, cytotoxic  chemotherapy, or radiation therapy may not be administered throughout the trial No systemically administered immunomodulator's permitted Notify study staff if you experience any signs/symptoms, visit provider in ER or scheduled evaluation, or start any new OTC, supplemental or prescription therapies Instructed to contact site if ISR gets worse or does not improve after 10 days from the last ISR assessment.   Yes No Completed:  [x]  []  Compensation:  You will receive a payment of $78.00 at the end of each non dosing visit completed to reimburse you for your time and effort.  You will receive $125.00 at the end of each dosing visit completed to reimburse you for your time and effort.   If you must return to the clinic for an unscheduled visit, you will receive $78.  Phone visits will be reimbursed at $30.00.  Amount: $125   [x]  []  Schedule Next Visit: Visits should be planned using a calendar months and scheduled based on the date of the Day 1 injection.    Remote Call Visits schedule on M7+2wk, M8+2wk, M12, M15, M19, M23, M27, M31 (Assess ISR, AES, Conmeds)  If transitioning to CAB 200mg /ml do not need LTFU visits, but do need ED visit and 4 week followup visit.  Date: 02Dec2025     Time: 0900   Version 3.1 LOA03 24Nov2025 JLS

## 2024-03-21 ENCOUNTER — Ambulatory Visit: Payer: Self-pay | Admitting: Gastroenterology

## 2024-03-22 ENCOUNTER — Encounter

## 2024-03-22 ENCOUNTER — Other Ambulatory Visit: Payer: Self-pay

## 2024-03-22 ENCOUNTER — Telehealth: Payer: Self-pay

## 2024-03-22 DIAGNOSIS — Z006 Encounter for examination for normal comparison and control in clinical research program: Secondary | ICD-10-CM

## 2024-03-22 NOTE — Telephone Encounter (Signed)
 Spoke with Alexis Hardin with [facility] confirmed referral and patient needs to do the seminar before she can schedule and she hasn't done that for the weight loss clinic P [phone] F [phone]

## 2024-03-22 NOTE — Research (Signed)
 ROBERTS [id number]                                                  Protocol Amendment 03  [facility] Site:  [phone]                                                                  TREATMENT PHASE   Visit:  [] M7+2W     [] M8    [] M8+2W   [] M9    [x] M9+1W   [] M10    [] M11    [] M12    [] M13   [] M13+1W    [] M15    [] M17    [] M17+1W     [] M19    [] M21   [] M23    [] M25   [] M27    [] M29    [] M31   [] M33   [] ED    [] 4 wk followup (if not entering LTFU)   [] LTFU M1   [] LTFU M4    [] LTFU M8    [] LTFU M12     Date: 02DEC2025         4 Letter Code: MELO Subject ID: [id number]  Assessments should occur in the following order:  eCSSRs  All other questionnaires  Triplicate 12-lead ECG  Vital signs  Blood draws (clinical laboratory tests, pre-dose pharmacokinetics, etc)  IP injection  Post-dose pharmacokinetics (if required)  Yes No Completed:  [x]  []  Verify participant identity before doing any study procedures   All Visits  [x]  []  Verify correct version of ICF is signed   All Visits   Yes No Questionnaires Completed: Patient Reported Outcome Assessment must be completed before other assessments take place at each designated visit. Completed electronically by participants.  []  [x]  CSSRs    Visits: M9, M13, M17, M21, M25, M29, M33, ED Any positive (abnormal) response indicating SIB or any unusual changes in behavior, confirmed by the investigator will result in their discontinuation of study intervention, the PI/SI will arrange for urgent specialist psychiatric evaluation and management  []  [x]  Social Determinants of Health Questionnaire (SDoH)     Visits: M17, ED  []  [x]  Study Medication Satisfaction Questionnaire (SMSQs)     Visits: M9, M13, M17, M21, M25, M29, M33, ED  []  [x]  PrEP Preference and Rationale Questionnaire (only if prior exposure to oral PrEP)     Visits: M9, M17, M25, M33, ED  []  [x]  Acceptability of Injection Questionnaire     Visits: M9, M13, M17, M21, M25, M29, M33, ED  []  [x]  Generalized  Anxiety Disorders (GAD-&) Questionnaire     Visits: M9, M13, M17, M21, M25, M29, M33, ED  []  [x]  HIV-risk and PrEP use Anxiety and Stigma Questionnaire prior to the study drug administration     Visits: M17, M29, M33, ED  []  [x]  Sexual Health Practices Questionnaire-administer prior to study drug administration Visits: M9, M13, M17, M21, M25, M29, M33, ED    Yes No Completed:  [x]  []  Review Changes in Concomitant Medications    All Visits  [x]  []  Assess for AEs, SAEs: All AEs (from Day 1 onwards) and SAEs (from Screening onwards) must be recorded in  eCRF at all visits and reported within required timelines.    All Visits  Is participant experiencing any symptomatic STIs currently?  [] Yes [x] No (if yes, perform STI testing)  Is participant experiencing any signs or symptoms consistent with acute HIV infection?  [] Yes [x] No (if yes, perform HIV testing)   [x]  []  Pull Review in:   Study: EXTEND4M Subj ID: [id number] Letter Code: MELO  Medication Review Medication Indication Dose/Route/Frequency Start Date End Date  Magnesium Glycinate Prophylaxis 50mg  PO QD 01Dec2024 31MAR2025  Nicotinamide Riboside Chloride Powder Prophylaxis 1 Capsule QD 01Dec2024 31MAR2025  Resveratrol Prophylaxis 1 Capsule QD 01Dec2024 31MAR2025  Cephalexin Urinary Tract Infection 500 mg tab QD 06Mar2025 11Mar2025  Amoxicillin   Urinary Tract Infection 875 mg tab BID 12Mar2025 22Mar2025  phenazopyridine  (PYRIDIUM )  Urinary Tract Infection 200 MG tablet TID 12Mar2025 17Mar2025  phenazopyridine  (PYRIDIUM )  Urinary Tract Infection 200 MG tablet TID 28mar2025 09APR2025  Ciprofloxacin  (Cipro ) Urinary tract infection 500 mg PO BID 28MAR2025 02APR2025  nitrofurantoin , macrocrystal-monohydrate, (MACROBID )  Urinary Tract Infection 100 mg PO BID  02APR2025 09APR2025  famotidine  (PEPCID )  nausea 20 mg tab PO BID 29MAR2025 09APR2025  iohexol  (OMNIPAQUE ) 9 MG/ML Oral contrast Abdominal pain  oral solution 500 mL once 29MAR2025  29MAR2025  alum & mag hydroxide-simeth (MAALOX/MYLANTA)  200-200-20 MG/5ML  nausea Oral suspension 30 mL once 29MAR2025 29MAR2025  lidocaine  (XYLOCAINE ) 2 %  nausea 15 mL PO once 29MAR2025 29MAR2025  lactated ringers  bolus 1,000 mL  lightheadedness 1,000 mL Intravenous once 29MAR2025 29MAR2025  ondansetron  (ZOFRAN )   nausea 4 mg IV once  29MAR2025 29MAR2025  ketorolac  (TORADOL )  Abdominal pain 15 mg Intravenous once 29MAR2025 29MAR2025  iohexol  (OMNIPAQUE ) 300 MG/ML Intravenous Contrast  Abdominal pain  80 mL IV once 29MAR2025 29MAR2025  ibuprofen Right ventrogluteal injection site tenderness 200mg  tab PO Q8 hours 02JUN2025 09JUN2025  metroNIDAZOLE (FLAGYL)  diarrhea 500mg  tab PO TID 04SEP2025 14SEP2025  Iron anemia 65mg  tab PO QD 04SEP2025 06SEP2025  Vitamin C anemia 125mg  tab PO QD 04SEP2025 06SEP2025  metroNIDAZOLE (FLAGYL)  diarrhea 500mg  tab PO TID 26SEP2025 06OCT2025   AE Review Diagnosis/Symptoms Indicate: NSAE SAE AESI Maximum Grade Related/Not Related to study intervention CAB LA or CAB ULA Action/Dose Change Start Date (Time, if available) End Date (Time, if available)  Right Ventrogluteal Injection Site Tenderness NSAE  1 Related to CAB LA No Change, No action taken 28Feb2025 02Mar2025  Urinary Tract Infection NSAE 2 Not Related to study intervention No change, no action taken 04Mar2025 22Mar2025  Urinary tract infection NSAE 2 Not related to study intervention No change, no action taken 28MAR2025 09APR2025  Abdominal pain NSAE 2 Not related to study intervention No change, no action taken 29MAR2025 09APR2025  Nausea NSAE 2 Not related to study intervention No change, no action taken 29MAR2025 09APR2025  Vomiting NSAE 2 Not related to study intervention No change, no action taken 29MAR2025 09APR2025  Lightheadedness NSAE 2 Not related to study intervention No change, no action taken 29MAR2025 30MAR2025  Hiatal hernia NSAE 2 Not related to study intervention No change, no action  taken 29MAR2025   Uterine leiomyoma, unspecified location  NSAE 2 Not related to study intervention No change, no action taken 29MAR2025   Right ventrogluteal injection site tenderness NSAE 1 Related to CAB ULA No change, no action 02JUN2025 09JUN2025  Viral Diarrhea NSAE 2 Not related to study intervention No change, no action 01SEP2025 05SEP2025  Viral Diarrhea NSAE 2 Not related to study intervention No change, No action 24SEP2025 06OCT2025  Increased creatinine ,  grade 3 NSAE 3 Not related to Study intervention No change, no action 01OCT2025    Medical History Review  Diagnosis Grade Start Date End Date  Bradycardia, asymptomatic 1 Unk 2025   Irritable Bowel Syndrome 2 Unk 2023   Intermittent Low Back Pain 2 05Aug2022   Conjunctivitis of Left Eye 2 08Jul2023 17Jul2023  Urinary Tract Infection 2 12Jan2024 17Jan2024  Anemia 2 17Jun2024      []  [x]  Contraception Review: Report in eCRF Visits: M8, M9, M10, M11, M13, M17, M21, M25, M29, M33, ED, 4WFU, LTFU Visits Female of childbearing potential: Must agree to use a highly effective method of contraception consistently and correctly, must also continue to use adequate contraception methods for at least 52 weeks after the last injection. Female condoms must be used in addition to hormonal contraception Sexual Abstinence: considered a highly effective method only if defined as refraining from heterosexual intercourse during the entire period of risk associated with the study intervention.  The reliability of sexual abstinence needs to be evaluated in relation to the duration of the study and the preferred and usual lifestyle of the participant.   Yes No/NA Completed:  []  [x]  Review Changes in Medical History:   Visits: M13, M21, M29, M33  Premature cardiovascular disease: female participant <65 years or female participant <55 years in first degree relatives only.  Note changes since the start of the study.    [] No Changes    Substance Usage:  (list substance, amount used, start and stop dates)  Social History   Substance and Sexual Activity  Alcohol Use Not Currently   Alcohol/week: 1.0 standard drink of alcohol   Types: 1 Glasses of wine per week    Social History   Substance and Sexual Activity  Drug Use Yes   Types: Marijuana   Comment: + on urine screen    Tobacco Use: Low Risk  (03/11/2024)   Patient History    Smoking Tobacco Use: Never    Smokeless Tobacco Use: Never    Passive Exposure: Never    Caffeine Use:  caffeine containing beverages/day  []  [x]  Triplicate 12-lead ECG     Visits: M9, M29, ED Perform in a semi-supine position after 5 minutes of rest.  3 individual ECG tracing should be obtained as closely as possible in succession, but no more than 2 minutes apart. The full set of triplicates should be completed in less than 6 minutes. If ECG abnormal clinically significant  then report as an AE/SAE.  Rest Start Time:  ECG #1 start time:     ECG #2:     ECG #3:    []  [x]  Vital Signs:  [] Weight, BMI (measured in a semi supine position after 5 minutes rest)  Visits:  M9, M13, M17, M21, M25, M29, M33, ED, 4WFU, LTFU Visits  [] Height    Visit: M9  [] BP and Pulse (measured in a semi supine position after 5 minutes rest)  Visits: M9, M13, M17, M21, M25, M29, M33, ED, 4WFU,  LTFU Visits  [] Temperature and RR:    Visits: M13, ED  Rest Start Time:   Vital Signs Start Time:     There is no height or weight on file to calculate BMI.  There were no vitals filed for this visit.   []  [x]  Brief Physical Examination: Will include at a minimum, assessments of skin, lungs, cardiovascular system, abdomen (liver and spleen). Perform the symptom directed exam when there are symptoms present. In addition to the brief physical exam.  Any abnormalities must be noted in the CRF (e.g current medical conditions or AE logs).     Visit: M8, M9, M11, M13, M17, M21, M25, M29, M33, ED, 4WFU, LTFU   Physical Exam    Yes No  Blood Draw Tracking:  All Visits  [x]  []  Verified previous Hgb to be >= 10 (if value <10, adjust volume per unit SOP)                          [] NA, healthy volunteer, previous Hgb results are unavailable  []  [x]  Assess Fasting Status.   Visits: M13, M33                                                      Is participant Fasting for at least 6 hours, overnight fast preferred?  []  No   []  Yes, Date/Time of last meal?   [x]  []  Blood Drawn.     Problems?  [x]  No   [] Yes, why?   Time: 0936    Total Vol: 2 mL   Yes No Lab Collection:  []  [x]  RAPID HIV TESTING        Visits: M9, M13, M17, M21, M25, M29, ED, 4WFU, LTFU  [] REACTIVE or [] NON REACTIVE  If positive/reactive participant must be withdrawn from the study even if subsequent confirmatory testing is negative. If reactive participant referred for further testing and clinical management as per local standard of care   []  [x]  Chemistry     Visits: M8, M9, M10, M11, M13, M17, M21, M25, M29, M33, ED, 4WFU, LTFU  []  [x]  Hematology     Visits:  M8, M9, M10, M11, M13, M17, M21, M25, M29, M33, ED, 4WFU, LTFU  []  [x]  Cystatin C     Visits:  M8, M9, M10, M11, M13, M17, M21, M25, M29, M33, ED, 4WFU, LTFU  []  [x]  Non rapid HIV immunoassay    Visits: M9, M13, M17, M21, M25, M29, M33, ED, 4WFU, LTFU  []  [x]  HIV-1 RNA     Visits: M9, M13, M17, M21, M25, M29, M33, ED, 4WFU, LTFU  []  [x]  Neisseria gonorrhea (GC), Chlamydia trachomatis (CT), Trichomononas vaginalis (TV), syphilis.  If positive we will refer for treatment as per local guidelines.   Visit: M9, M13, M17, M21, M25, M29, M33, ED, 4WFU, LTFU, PRN   **PRN if symptomatic at other visits** Appropriate swabs to be collected based on reported sexual history.  [] GC/CT NAAT urine/vaginal swab [] GC and CT: Rectal Swab [] Oropharyngeal Swab [] TV: vaginal Swab   *If female and menstruating may take sample at next visit  []  [x]  Coagulation Tests     Visits: M13, M33, ED  []  [x]  Hepatitis B&C      Visits: M13, M25, ED  []  [x]  Fasting Labs: Glucose     Visits: M13, M33, ED (Insulin, HbA1c only collect if withdrawal visit occurs at M13 or M33)  []  [x]  Plasma for Storage     Visits: M8, M9, M10, M11, M13, M17, M21, M25, M29, ED  []  [x]  Predose: PK Sampling  Collect  < 1 hr before injection Visits: M9, M13, M17, M21, M25, M29  []  [x]  Illicit Drug Screen     Visits: M13, M21, M29, M33  []  [x]  Urinalysis: Morning specimen is preferred     Visits: M13,  M33, ED, 4WFU   Time of Collection:   LMP: Patient's last menstrual period was 02/08/2024.  []  N/A   Yes No/NA Lab Collection, Cont.:  []  [x]  Urine Pregnancy Test (POCBP only)  Visits: M9, M13, M17, M21, M25, M29, M33 , ED, 4WFU, LTFU       Note: Must be resulted prior to receipt of IP  Time of Collection:     [] REACTIVE or [] NON REACTIVE  [] NA, assigned Female at birth or Not a POCBP   []                          [x]  Serum Pregnancy Test ([facility] only) if urine test positive, perform a serum test to confirm.   Yes No/NA Study Drug Administration  []  [x]  CAB ULA Administration:   Visit: M9, M13, M17, M21, M25, M29  Sites should be rotated between right and left Needle Size: [] 1.5  [] 2 (For BMI >/= 30)   There is no height or weight on file to calculate BMI.  Gauge: [] 21G   Site: [] Left Ventrogluteal  [] Right Ventrogluteal  Time of Administration:    [x]  []  ISR Review completed, ISR photography necessary [] Yes[x] No     All Visits *Digital photographs will be documented at all visits (scheduled or unscheduled) on all participants who have an injection site reaction that is a visible, persistent Grade 2 (no improvement/resolution in >10 days), Grade >/=3, or serious. *Examination will include a physician/HCP assessment of pain (or tenderness), pruritus, warmth, infections, rash, erythema (or redness), swelling (or induration), and nodules (granulomas or cysts) *For injection site nodules, the largest diameter will be recorded in the eCRF at  each ISR assessment for nodules of at least Grade 1 severity  *Remote Visits: M7+2W, M8+2W, M9+1W, M12, M15, M19, M23, M27, M31 should assess whether participants have any new ISR symptoms/signs since their last visit or have previously reported ISR that has not improved or is worsening, if present site should perform an unscheduled visits ASAP for ISR assessment   Yes No/NA  Pharmacokinetic Sampling  [x]  []   Post Dose PK sampling:  Visits: M8, M9, M9+1Wk, M10, M11, M13, M13+1, M17+1, M33, ED, 4WFU, LTFU   On dosing days collect PK 2 hours after injection (+/- 30 mins collection window)  Time of PK Collection: 0936    Yes No Completed:  [x]  []   HIV/STI  Counseling/Condom Distribution:    All Visits, except remote calls Condoms: [] Accepted  [x] Declined   Yes No/NA Completed  []  [x]  Was there an ISR during remote phone call visit:  [] Yes [x]  No If yes, schedule unscheduled ISR assessment ASAP Date:   [x]  []  Assess for Protocol Deviations  [x]  []  Reminders:  Participants of childbearing potential agree to use a highly effective method of contraception consistently and correctly HIV vaccines are not permitted at any time during the study No other experimental agents, antiretroviral drugs, cytotoxic chemotherapy, or radiation therapy may not be administered throughout the trial No systemically administered immunomodulator's permitted Notify study staff if you experience any signs/symptoms, visit provider in ER or scheduled evaluation, or start any new OTC, supplemental or prescription therapies Instructed to contact site if ISR gets worse or does not improve after 10 days from the last ISR assessment.   Yes No Completed:  [x]  []  Compensation:  You will receive a payment of $78.00 at the end of each non dosing visit completed to reimburse you for your time and effort.  You will  receive $125.00 at the end of each dosing visit completed to reimburse you for your time and effort.   If you  must return to the clinic for an unscheduled visit, you will receive $78.  Phone visits will be reimbursed at $30.00.  Amount: $78.00   [x]  []  Schedule Next Visit: Visits should be planned using a calendar months and scheduled based on the date of the Day 1 injection.    Remote Call Visits schedule on M7+2wk, M8+2wk, M12, M15, M19, M23, M27, M31 (Assess ISR, AES, Conmeds)  If transitioning to CAB 200mg /ml do not need LTFU visits, but do need ED visit and 4 week followup visit.  Date: 22DEC2025     Time: 0900   Version 3.1 LOA03 24Nov2025 JLS

## 2024-03-23 NOTE — Research (Cosign Needed Addendum)
 Updated AE form with resolution of elevated creatinine after two months of consistent normal lab results.  Also, updated Elevated Creatinine start date to reflect the date the labs were collected.   Alexis Hardin ID: [id number] Letter Code: MELO  Medication Review Medication Indication Dose/Route/Frequency Start Date End Date  Magnesium Glycinate Prophylaxis 50mg  PO QD 01Dec2024 31MAR2025  Nicotinamide Riboside Chloride Powder Prophylaxis 1 Capsule QD 01Dec2024 31MAR2025  Resveratrol Prophylaxis 1 Capsule QD 01Dec2024 31MAR2025  Cephalexin Urinary Tract Infection 500 mg tab QD 06Mar2025 11Mar2025  Amoxicillin   Urinary Tract Infection 875 mg tab BID 12Mar2025 22Mar2025  phenazopyridine  (PYRIDIUM )  Urinary Tract Infection 200 MG tablet TID 12Mar2025 17Mar2025  phenazopyridine  (PYRIDIUM )  Urinary Tract Infection 200 MG tablet TID 28mar2025 09APR2025  Ciprofloxacin  (Cipro ) Urinary tract infection 500 mg PO BID 28MAR2025 02APR2025  nitrofurantoin , macrocrystal-monohydrate, (MACROBID )  Urinary Tract Infection 100 mg PO BID  02APR2025 09APR2025  famotidine  (PEPCID )  nausea 20 mg tab PO BID 29MAR2025 09APR2025  iohexol  (OMNIPAQUE ) 9 MG/ML Oral contrast Abdominal pain  oral solution 500 mL once 29MAR2025 29MAR2025  alum & mag hydroxide-simeth (MAALOX/MYLANTA)  200-200-20 MG/5ML  nausea Oral suspension 30 mL once 29MAR2025 29MAR2025  lidocaine  (XYLOCAINE ) 2 %  nausea 15 mL PO once 29MAR2025 29MAR2025  lactated ringers  bolus 1,000 mL  lightheadedness 1,000 mL Intravenous once 29MAR2025 29MAR2025  ondansetron  (ZOFRAN )   nausea 4 mg IV once  29MAR2025 29MAR2025  ketorolac  (TORADOL )  Abdominal pain 15 mg Intravenous once 29MAR2025 29MAR2025  iohexol  (OMNIPAQUE ) 300 MG/ML Intravenous Contrast  Abdominal pain  80 mL IV once 29MAR2025 29MAR2025  ibuprofen Right ventrogluteal injection site tenderness 200mg  tab PO Q8 hours 02JUN2025 09JUN2025  metroNIDAZOLE (FLAGYL)  diarrhea 500mg  tab PO TID  04SEP2025 14SEP2025  Iron anemia 65mg  tab PO QD 04SEP2025 06SEP2025  Vitamin C anemia 125mg  tab PO QD 04SEP2025 06SEP2025  metroNIDAZOLE (FLAGYL)  diarrhea 500mg  tab PO TID 26SEP2025 06OCT2025   AE Review Diagnosis/Symptoms Indicate: NSAE SAE AESI Maximum Grade Related/Not Related to study intervention CAB LA or CAB ULA Action/Dose Change Start Date (Time, if available) End Date (Time, if available)  Right Ventrogluteal Injection Site Tenderness NSAE  1 Related to CAB LA No Change, No action taken 28Feb2025 02Mar2025  Urinary Tract Infection NSAE 2 Not Related to study intervention No change, no action taken 04Mar2025 22Mar2025  Urinary tract infection NSAE 2 Not related to study intervention No change, no action taken 28MAR2025 09APR2025  Abdominal pain NSAE 2 Not related to study intervention No change, no action taken 29MAR2025 09APR2025  Nausea NSAE 2 Not related to study intervention No change, no action taken 29MAR2025 09APR2025  Vomiting NSAE 2 Not related to study intervention No change, no action taken 29MAR2025 09APR2025  Lightheadedness NSAE 2 Not related to study intervention No change, no action taken 29MAR2025 30MAR2025  Hiatal hernia NSAE 2 Not related to study intervention No change, no action taken 29MAR2025   Uterine leiomyoma, unspecified location  NSAE 2 Not related to study intervention No change, no action taken 29MAR2025   Right ventrogluteal injection site tenderness NSAE 1 Related to CAB ULA No change, no action 02JUN2025 09JUN2025  Viral Diarrhea NSAE 2 Not related to study intervention No change, no action 01SEP2025 05SEP2025  Viral Diarrhea NSAE 2 Not related to study intervention No change, No action 24SEP2025 06OCT2025  Increased creatinine , grade 3 NSAE 3 Not related to Study intervention No change, no action 27Sep2025 27Oct2025   Medical History Review  Diagnosis Grade Start Date End Date  Bradycardia, asymptomatic 1 Unk 2025   Irritable Bowel Syndrome 2  Unk 2023   Intermittent Low Back Pain 2 05Aug2022   Conjunctivitis of Left Eye 2 08Jul2023 17Jul2023  Urinary Tract Infection 2 12Jan2024 17Jan2024  Anemia 2 17Jun2024

## 2024-03-23 NOTE — Research (Signed)
 SABRA

## 2024-03-25 NOTE — Progress Notes (Signed)
 I have reviewed this research participant's data including  laboratory values for EXTEND 15M.  Note she had an adverse event of increased creatinine that was not related to study product and which was subsequently resolved.

## 2024-03-25 NOTE — Progress Notes (Signed)
 I have reviewed this research participants clinical data from this visit including laboratory values for this EXTEND [id number]  study visit.

## 2024-04-11 ENCOUNTER — Other Ambulatory Visit: Payer: Self-pay

## 2024-04-11 ENCOUNTER — Encounter

## 2024-04-11 DIAGNOSIS — Z006 Encounter for examination for normal comparison and control in clinical research program: Secondary | ICD-10-CM

## 2024-04-11 NOTE — Research (Signed)
 ROBERTS [id number]                                                  Protocol Amendment 03  [facility]:  [phone]                                                                  TREATMENT PHASE   Visit:  [] M7+2W     [] M8    [] M8+2W   [] M9    [] M9+1W   [x] M10    [] M11    [] M12    [] M13   [] M13+1W    [] M15    [] M17    [] M17+1W     [] M19    [] M21   [] M23    [] M25   [] M27    [] M29    [] M31   [] M33   [] ED    [] 4 wk followup (if not entering LTFU)   [] LTFU M1   [] LTFU M4    [] LTFU M8    [] LTFU M12     Date: 22Dec2025         4 Letter Code: MELO Subject ID: [id number]  Assessments should occur in the following order:  eCSSRs  All other questionnaires  Triplicate 12-lead ECG  Vital signs  Blood draws (clinical laboratory tests, pre-dose pharmacokinetics, etc)  IP injection  Post-dose pharmacokinetics (if required)  Yes No Completed:  [x]  []  Verify participant identity before doing any study procedures   All Visits  [x]  []  Verify correct version of ICF is signed   All Visits   Yes No Questionnaires Completed: Patient Reported Outcome Assessment must be completed before other assessments take place at each designated visit. Completed electronically by participants.  []  [x]  CSSRs    Visits: M9, M13, M17, M21, M25, M29, M33, ED Any positive (abnormal) response indicating SIB or any unusual changes in behavior, confirmed by the investigator will result in their discontinuation of study intervention, the PI/SI will arrange for urgent specialist psychiatric evaluation and management  []  [x]  Social Determinants of Health Questionnaire (SDoH)     Visits: M17, ED  []  [x]  Study Medication Satisfaction Questionnaire (SMSQs)     Visits: M9, M13, M17, M21, M25, M29, M33, ED  []  [x]  PrEP Preference and Rationale Questionnaire (only if prior exposure to oral PrEP)     Visits: M9, M17, M25, M33, ED  []  [x]  Acceptability of Injection Questionnaire     Visits: M9, M13, M17, M21, M25, M29, M33, ED  []  [x]  Generalized  Anxiety Disorders (GAD-&) Questionnaire     Visits: M9, M13, M17, M21, M25, M29, M33, ED  []  [x]  HIV-risk and PrEP use Anxiety and Stigma Questionnaire prior to the study drug administration     Visits: M17, M29, M33, ED  []  [x]  Sexual Health Practices Questionnaire-administer prior to study drug administration Visits: M9, M13, M17, M21, M25, M29, M33, ED    Yes No Completed:  [x]  []  Review Changes in Concomitant Medications    All Visits  [x]  []  Assess for AEs, SAEs: All AEs (from Day 1 onwards) and SAEs (from Screening onwards) must be recorded in  eCRF at all visits and reported within required timelines.    All Visits  Is participant experiencing any symptomatic STIs currently?  [] Yes [x] No (if yes, perform STI testing)  Is participant experiencing any signs or symptoms consistent with acute HIV infection?  [] Yes [x] No (if yes, perform HIV testing)   [x]  []  Pull Review in:   Study: EXTEND4M Subj ID: [id number] Letter Code: MELO  Medication Review Medication Indication Dose/Route/Frequency Start Date End Date  Magnesium Glycinate Prophylaxis 50mg  PO QD 01Dec2024 31MAR2025  Nicotinamide Riboside Chloride Powder Prophylaxis 1 Capsule QD 01Dec2024 31MAR2025  Resveratrol Prophylaxis 1 Capsule QD 01Dec2024 31MAR2025  Cephalexin Urinary Tract Infection 500 mg tab QD 06Mar2025 11Mar2025  Amoxicillin   Urinary Tract Infection 875 mg tab BID 12Mar2025 22Mar2025  phenazopyridine  (PYRIDIUM )  Urinary Tract Infection 200 MG tablet TID 12Mar2025 17Mar2025  phenazopyridine  (PYRIDIUM )  Urinary Tract Infection 200 MG tablet TID 28mar2025 09APR2025  Ciprofloxacin  (Cipro ) Urinary tract infection 500 mg PO BID 28MAR2025 02APR2025  nitrofurantoin , macrocrystal-monohydrate, (MACROBID )  Urinary Tract Infection 100 mg PO BID  02APR2025 09APR2025  famotidine  (PEPCID )  nausea 20 mg tab PO BID 29MAR2025 09APR2025  iohexol  (OMNIPAQUE ) 9 MG/ML Oral contrast Abdominal pain  oral solution 500 mL once 29MAR2025  29MAR2025  alum & mag hydroxide-simeth (MAALOX/MYLANTA)  200-200-20 MG/5ML  nausea Oral suspension 30 mL once 29MAR2025 29MAR2025  lidocaine  (XYLOCAINE ) 2 %  nausea 15 mL PO once 29MAR2025 29MAR2025  lactated ringers  bolus 1,000 mL  lightheadedness 1,000 mL Intravenous once 29MAR2025 29MAR2025  ondansetron  (ZOFRAN )   nausea 4 mg IV once  29MAR2025 29MAR2025  ketorolac  (TORADOL )  Abdominal pain 15 mg Intravenous once 29MAR2025 29MAR2025  iohexol  (OMNIPAQUE ) 300 MG/ML Intravenous Contrast  Abdominal pain  80 mL IV once 29MAR2025 29MAR2025  ibuprofen Right ventrogluteal injection site tenderness 200mg  tab PO Q8 hours 02JUN2025 09JUN2025  metroNIDAZOLE (FLAGYL)  diarrhea 500mg  tab PO TID 04SEP2025 14SEP2025  Iron anemia 65mg  tab PO QD 04SEP2025 06SEP2025  Vitamin C anemia 125mg  tab PO QD 04SEP2025 06SEP2025  metroNIDAZOLE (FLAGYL)  diarrhea 500mg  tab PO TID 26SEP2025 06OCT2025   AE Review Diagnosis/Symptoms Indicate: NSAE SAE AESI Maximum Grade Related/Not Related to study intervention CAB LA or CAB ULA Action/Dose Change Start Date (Time, if available) End Date (Time, if available)  Right Ventrogluteal Injection Site Tenderness NSAE  1 Related to CAB LA No Change, No action taken 28Feb2025 02Mar2025  Urinary Tract Infection NSAE 2 Not Related to study intervention No change, no action taken 04Mar2025 22Mar2025  Urinary tract infection NSAE 2 Not related to study intervention No change, no action taken 28MAR2025 09APR2025  Abdominal pain NSAE 2 Not related to study intervention No change, no action taken 29MAR2025 09APR2025  Nausea NSAE 2 Not related to study intervention No change, no action taken 29MAR2025 09APR2025  Vomiting NSAE 2 Not related to study intervention No change, no action taken 29MAR2025 09APR2025  Lightheadedness NSAE 2 Not related to study intervention No change, no action taken 29MAR2025 30MAR2025  Hiatal hernia NSAE 2 Not related to study intervention No change, no action  taken 29MAR2025   Uterine leiomyoma, unspecified location  NSAE 2 Not related to study intervention No change, no action taken 29MAR2025   Right ventrogluteal injection site tenderness NSAE 1 Related to CAB ULA No change, no action 02JUN2025 09JUN2025  Viral Diarrhea NSAE 2 Not related to study intervention No change, no action 01SEP2025 05SEP2025  Viral Diarrhea NSAE 2 Not related to study intervention No change, No action 24SEP2025 06OCT2025  Increased creatinine ,  grade 3 NSAE 3 Not related to Study intervention No change, no action 29Sep2025 27Oct2025   Medical History Review  Diagnosis Grade Start Date End Date  Bradycardia, asymptomatic 1 Unk 2025   Irritable Bowel Syndrome 2 Unk 2023   Intermittent Low Back Pain 2 05Aug2022   Conjunctivitis of Left Eye 2 08Jul2023 17Jul2023  Urinary Tract Infection 2 12Jan2024 17Jan2024  Anemia 2 17Jun2024      [x]  []  Contraception Review: Report in eCRF Visits: M8, M9, M10, M11, M13, M17, M21, M25, M29, M33, ED, 4WFU, LTFU Visits Female of childbearing potential: Must agree to use a highly effective method of contraception consistently and correctly, must also continue to use adequate contraception methods for at least 52 weeks after the last injection. Female condoms must be used in addition to hormonal contraception Sexual Abstinence: considered a highly effective method only if defined as refraining from heterosexual intercourse during the entire period of risk associated with the study intervention.  The reliability of sexual abstinence needs to be evaluated in relation to the duration of the study and the preferred and usual lifestyle of the participant.   Yes No/NA Completed:  []  [x]  Review Changes in Medical History:   Visits: M13, M21, M29, M33  Premature cardiovascular disease: female participant <65 years or female participant <55 years in first degree relatives only.  Note changes since the start of the study.    [] No Changes    Substance  Usage: (list substance, amount used, start and stop dates)  Social History   Substance and Sexual Activity  Alcohol Use Not Currently   Alcohol/week: 1.0 standard drink of alcohol   Types: 1 Glasses of wine per week    Social History   Substance and Sexual Activity  Drug Use Yes   Types: Marijuana   Comment: + on urine screen    Tobacco Use: Low Risk (03/11/2024)   Patient History    Smoking Tobacco Use: Never    Smokeless Tobacco Use: Never    Passive Exposure: Never    Caffeine Use: caffeine containing beverages/day  []  [x]  Triplicate 12-lead ECG     Visits: M9, M29, ED Perform in a semi-supine position after 5 minutes of rest.  3 individual ECG tracing should be obtained as closely as possible in succession, but no more than 2 minutes apart. The full set of triplicates should be completed in less than 6 minutes. If ECG abnormal clinically significant  then report as an AE/SAE.  Rest Start Time:  ECG #1 start time:     ECG #2:     ECG #3:    []  [x]  Vital Signs:  [] Weight, BMI (measured in a semi supine position after 5 minutes rest)  Visits:  M9, M13, M17, M21, M25, M29, M33, ED, 4WFU, LTFU Visits  [] Height    Visit: M9  [] BP and Pulse (measured in a semi supine position after 5 minutes rest)  Visits: M9, M13, M17, M21, M25, M29, M33, ED, 4WFU,  LTFU Visits  [] Temperature and RR:    Visits: M13, ED  Rest Start Time:   Vital Signs Start Time:     There is no height or weight on file to calculate BMI.  There were no vitals filed for this visit.   []  [x]  Brief Physical Examination: Will include at a minimum, assessments of skin, lungs, cardiovascular system, abdomen (liver and spleen). Perform the symptom directed exam when there are symptoms present. In addition to the brief physical exam. Any abnormalities  must be noted in the CRF (e.g current medical conditions or AE logs).     Visit: M8, M9, M11, M13, M17, M21, M25, M29, M33, ED, 4WFU, LTFU   Physical Exam    Yes No  Blood Draw Tracking:  All Visits  [x]  []  Verified previous Hgb to be >= 10 (if value <10, adjust volume per unit SOP)                          [] NA, healthy volunteer, previous Hgb results are unavailable  []  [x]  Assess Fasting Status.   Visits: M13, M33                                                      Is participant Fasting for at least 6 hours, overnight fast preferred?  []  No   []  Yes, Date/Time of last meal?   [x]  []  Blood Drawn.     Problems?  [x]  No   [] Yes, why?   Time: 0902    Total Vol: 14 mL   Yes No Lab Collection:  []  [x]  RAPID HIV TESTING        Visits: M9, M13, M17, M21, M25, M29, ED, 4WFU, LTFU  [] REACTIVE or [] NON REACTIVE  If positive/reactive participant must be withdrawn from the study even if subsequent confirmatory testing is negative. If reactive participant referred for further testing and clinical management as per local standard of care   [x]  []  Chemistry     Visits: M8, M9, M10, M11, M13, M17, M21, M25, M29, M33, ED, 4WFU, LTFU  [x]  []  Hematology     Visits:  M8, M9, M10, M11, M13, M17, M21, M25, M29, M33, ED, 4WFU, LTFU  []  [x]  Cystatin C     Visits:  M8, M9, M10, M11, M13, M17, M21, M25, M29, M33, ED, 4WFU, LTFU Not available in current lab kits, new kits to match new amendment not yet available.   []  [x]  Non rapid HIV immunoassay    Visits: M9, M13, M17, M21, M25, M29, M33, ED, 4WFU, LTFU  []  [x]  HIV-1 RNA     Visits: M9, M13, M17, M21, M25, M29, M33, ED, 4WFU, LTFU  []  [x]  Neisseria gonorrhea (GC), Chlamydia trachomatis (CT), Trichomononas vaginalis (TV), syphilis.  If positive we will refer for treatment as per local guidelines.   Visit: M9, M13, M17, M21, M25, M29, M33, ED, 4WFU, LTFU, PRN   **PRN if symptomatic at other visits** Appropriate swabs to be collected based on reported sexual history.  [] GC/CT NAAT urine/vaginal swab [] GC and CT: Rectal Swab [] Oropharyngeal Swab [] TV: vaginal Swab   *If female and menstruating may take sample at next  visit  []  [x]  Coagulation Tests     Visits: M13, M33, ED  []  [x]  Hepatitis B&C     Visits: M13, M25, ED  []  [x]  Fasting Labs: Glucose     Visits: M13, M33, ED (Insulin, HbA1c only collect if withdrawal visit occurs at M13 or M33)  [x]  []  Plasma for Storage     Visits: M8, M9, M10, M11, M13, M17, M21, M25, M29, ED  []  [x]  Predose: PK Sampling  Collect  < 1 hr before injection Visits: M9, M13, M17, M21, M25, M29  []  [x]  Illicit Drug Screen     Visits: M13, M21, M29, M33  []  [  x] Urinalysis: Morning specimen is preferred     Visits: M13, M33, ED, 4WFU   Time of Collection:   LMP: No LMP recorded.  []  N/A   Yes No/NA Lab Collection, Cont.:  []  [x]  Urine Pregnancy Test (POC[facility] only)  Visits: M9, M13, M17, M21, M25, M29, M33 , ED, 4WFU, LTFU       Note: Must be resulted prior to receipt of IP  Time of Collection:     [] REACTIVE or [] NON REACTIVE  [] NA, assigned Female at birth or Not a POCBP   []                          [x]  Serum Pregnancy Test ([facility] only) if urine test positive, perform a serum test to confirm.   Yes No/NA Study Drug Administration  []  [x]  CAB ULA Administration:   Visit: M9, M13, M17, M21, M25, M29  Sites should be rotated between right and left Needle Size: [] 1.5  [] 2 (For BMI >/= 30)   There is no height or weight on file to calculate BMI.  Gauge: [] 21G   Site: [] Left Ventrogluteal  [] Right Ventrogluteal  Time of Administration:    [x]  []  ISR Review completed, ISR photography necessary [] Yes[x] No     All Visits *Digital photographs will be documented at all visits (scheduled or unscheduled) on all participants who have an injection site reaction that is a visible, persistent Grade 2 (no improvement/resolution in >10 days), Grade >/=3, or serious. *Examination will include a physician/HCP assessment of pain (or tenderness), pruritus, warmth, infections, rash, erythema (or redness), swelling (or induration), and nodules (granulomas or cysts) *For injection site nodules,  the largest diameter will be recorded in the eCRF at each ISR assessment for nodules of at least Grade 1 severity  *Remote Visits: M7+2W, M8+2W, M9+1W, M12, M15, M19, M23, M27, M31 should assess whether participants have any new ISR symptoms/signs since their last visit or have previously reported ISR that has not improved or is worsening, if present site should perform an unscheduled visits ASAP for ISR assessment   Yes No/NA  Pharmacokinetic Sampling  [x]  []   Post Dose PK sampling:  Visits: M8, M9, M9+1Wk, M10, M11, M13, M13+1, M17+1, M33, ED, 4WFU, LTFU   On dosing days collect PK 2 hours after injection (+/- 30 mins collection window)  Time of PK Collection: 0902    Yes No Completed:  [x]  []   HIV/STI  Counseling/Condom Distribution:    All Visits, except remote calls Condoms: [] Accepted  [x] Declined   Yes No/NA Completed  []  [x]  Was there an ISR during remote phone call visit:  [] Yes [x]  No If yes, schedule unscheduled ISR assessment ASAP Date:   [x]  []  Assess for Protocol Deviations  [x]  []  Reminders:  Participants of childbearing potential agree to use a highly effective method of contraception consistently and correctly HIV vaccines are not permitted at any time during the study No other experimental agents, antiretroviral drugs, cytotoxic chemotherapy, or radiation therapy may not be administered throughout the trial No systemically administered immunomodulator's permitted Notify study staff if you experience any signs/symptoms, visit provider in ER or scheduled evaluation, or start any new OTC, supplemental or prescription therapies Instructed to contact site if ISR gets worse or does not improve after 10 days from the last ISR assessment.   Yes No Completed:  [x]  []  Compensation:  You will receive a payment of $78.00 at the end of each non dosing visit completed to reimburse  you for your time and effort.  You will receive $125.00 at the end of each dosing visit completed to  reimburse you for your time and effort.   If you must return to the clinic for an unscheduled visit, you will receive $78.  Phone visits will be reimbursed at $30.00.  Amount: $78.00   [x]  []  Schedule Next Visit: Visits should be planned using a calendar months and scheduled based on the date of the Day 1 injection.    Remote Call Visits schedule on M7+2wk, M8+2wk, M12, M15, M19, M23, M27, M31 (Assess ISR, AES, Conmeds)  If transitioning to CAB 200mg /ml do not need LTFU visits, but do need ED visit and 4 week followup visit.  Date: 20Jan2026     Time: 0900   Version 3.1 LOA03 24Nov2025 JLS

## 2024-04-22 ENCOUNTER — Telehealth: Payer: Self-pay | Admitting: Pediatrics

## 2024-04-22 MED ORDER — NA SULFATE-K SULFATE-MG SULF 17.5-3.13-1.6 GM/177ML PO SOLN: 1.0000 | Freq: Once | ORAL | 0 refills | Status: AC

## 2024-04-22 NOTE — Telephone Encounter (Signed)
 Script was sent in 11-21 the pharmacy and the patient claimed she went up there and they didn't have it. Resent in prescription to the same pharmacy

## 2024-04-22 NOTE — Telephone Encounter (Signed)
(  [phone] and [phone] the numbers that were given when she asked about cost  Cash price for prep 42.69 insurance will not cover it and patient made aware

## 2024-04-22 NOTE — Telephone Encounter (Signed)
 Inbound call from patients husband stating wife is scheduled for a double procedure on 04/25/24 but has not received prep medication would like prep medication sent to [facility] in [place].  Please advise  Thank you

## 2024-04-24 NOTE — Progress Notes (Unsigned)
 [facility] History and Physical   Primary Care Physician:  Liana Fish, NP   Reason for Procedure:  GERD, heartburn, history of gastric sleeve, diarrhea, constipation, abdominal pain, iron deficiency anemia  Plan:    Upper endoscopy and colonoscopy   The patient was provided an opportunity to ask questions and all were answered. The patient agreed with the plan.   HPI: Alexis Hardin is a 49 y.o. female undergoing upper endoscopy and colonoscopy for investigation of GERD, heartburn, history of gastric sleeve, diarrhea, constipation, abdominal pain and iron deficiency anemia.  Patient was seen in the office 02/2024 endorsing symptoms noted above.  Previously had an episode of a diarrheal illness over the summer and then developed constipation.  Described her gut as being really inflamed.  Given a trial of Linzess.  No prior colonoscopy.  Laboratory testing for celiac negative.  Managing GERD with lansoprazole  OTC.  Laboratory evaluation disclose deficiency anemia with hemoglobin 10.2, iron 12, ferritin 2.8, TIBC 464.8.  No documented family history of colorectal cancer or polyps.   Past Medical History:  Diagnosis Date   Anemia 10/06/2022   Bradycardia 2025   Asymptomatic   Conjunctivitis 10/26/2021   of the left eye, resolved 17Jul2023   Intermittent Low back pain 11/23/2020   Irritable bowel syndrome (IBS) 2023   Urinary tract infection 05/02/2022   resolved 17Jan2024    Past Surgical History:  Procedure Laterality Date   CESAREAN SECTION  2011   CHOLECYSTECTOMY  2011   COSMETIC SURGERY  2020   Breast Augmentation, Gastric Sleeve   REFRACTIVE SURGERY  2006   LASIK    Prior to Admission medications  Medication Sig Start Date End Date Taking? Authorizing Provider  famotidine  (PEPCID ) 20 MG tablet Take 1 tablet (20 mg total) by mouth 2 (two) times daily for 14 days. Patient not taking: Reported on 03/14/2024 07/18/23 03/11/24  Waymond Lorelle Cummins, MD   Magnesium Glycinate 100 MG CAPS Take 50 mg by mouth daily. Patient not taking: Reported on 03/14/2024    [provider]  Nicotinamide Riboside Chloride POWD 1 capsule by Does not apply route daily. Patient not taking: Reported on 03/14/2024 04/22/23   [provider]  nitrofurantoin , macrocrystal-monohydrate, (MACROBID ) 100 MG capsule Take 1 capsule (100 mg total) by mouth 2 (two) times daily. Patient not taking: Reported on 03/14/2024 07/22/23   McDonough, Lauren K, PA-C  phenazopyridine  (PYRIDIUM ) 200 MG tablet Take 1 tablet (200 mg total) by mouth 3 (three) times daily as needed for pain. Patient not taking: Reported on 03/14/2024 07/01/23   Abernathy, Alyssa, NP  RESVERATROL PO Take 1 capsule by mouth daily. Patient not taking: Reported on 03/14/2024 04/22/23   [provider]    Current Outpatient Medications  Medication Sig Dispense Refill   famotidine  (PEPCID ) 20 MG tablet Take 1 tablet (20 mg total) by mouth 2 (two) times daily for 14 days. (Patient not taking: Reported on 03/14/2024) 28 tablet 0   Magnesium Glycinate 100 MG CAPS Take 50 mg by mouth daily. (Patient not taking: Reported on 03/14/2024)     Nicotinamide Riboside Chloride POWD 1 capsule by Does not apply route daily. (Patient not taking: Reported on 03/14/2024)     nitrofurantoin , macrocrystal-monohydrate, (MACROBID ) 100 MG capsule Take 1 capsule (100 mg total) by mouth 2 (two) times daily. (Patient not taking: Reported on 03/14/2024) 20 capsule 0   phenazopyridine  (PYRIDIUM ) 200 MG tablet Take 1 tablet (200 mg total) by mouth 3 (three) times daily as needed  for pain. (Patient not taking: Reported on 03/14/2024) 15 tablet 0   RESVERATROL PO Take 1 capsule by mouth daily. (Patient not taking: Reported on 03/14/2024)     Current Facility-Administered Medications  Medication Dose Route Frequency Provider Last Rate Last Admin   Study - EXTEND - cabotegravir  ULA ([id number]) 1600 mg/3 mL intramuscular  injection ([facility])  1,600 mg Intramuscular Q4 months    1,600 mg at 11/16/23 1010   Study - EXTEND - cabotegravir  ULA ([id number]) 1600 mg/3 mL intramuscular injection ([facility])  1,600 mg Intramuscular Q4 months    1,600 mg at 03/14/24 1117    Allergies as of 04/25/2024   (No Known Allergies)    No family history on file.  Social History   Socioeconomic History   Marital status: Married    Spouse name: Not on file   Number of children: Not on file   Years of education: Not on file   Highest education level: Not on file  Occupational History   Not on file  Tobacco Use   Smoking status: Never    Passive exposure: Never   Smokeless tobacco: Never  Vaping Use   Vaping status: Never Used  Substance and Sexual Activity   Alcohol use: Not Currently    Alcohol/week: 1.0 standard drink of alcohol    Types: 1 Glasses of wine per week   Drug use: Yes    Types: Marijuana    Comment: + on urine screen   Sexual activity: Yes    Partners: Male    Birth control/protection: Condom    Comment: 1 partner, husband-vesectomy-2011, engages in vaginal, oral and anal sex  Other Topics Concern   Not on file  Social History Narrative   Not on file   Social Drivers of Health   Tobacco Use: Low Risk (03/11/2024)   Patient History    Smoking Tobacco Use: Never    Smokeless Tobacco Use: Never    Passive Exposure: Never  Financial Resource Strain: Medium Risk (12/24/2023)   Received from [facility]   Overall Financial Resource Strain (CARDIA)    How hard is it for you to pay for the very basics like food, housing, medical care, and heating?: Somewhat hard  Food Insecurity: No Food Insecurity (12/24/2023)   Received from [facility]   Epic    Within the past 12 months, you worried that your food would run out before you got the money to buy more.: Never true    Within the past 12 months, the food you bought just didn't last and you didn't have money to get more.: Never true   Transportation Needs: No Transportation Needs (12/24/2023)   Received from [facility]    In the past 12 months, has lack of transportation kept you from medical appointments or from getting medications?: No    In the past 12 months, has lack of transportation kept you from meetings, work, or from getting things needed for daily living?: No  Physical Activity: Not on file  Stress: Not on file  Social Connections: Not on file  Intimate Partner Violence: Not on file  Depression (PHQ2-9): Low Risk (10/06/2022)   Depression (PHQ2-9)    PHQ-2 Score: 0  Alcohol Screen: Not on file  Housing: Low Risk (12/24/2023)   Received from [facility]    In the last 12 months, was there a time when you were not able to pay the mortgage or rent on time?: No  In the past 12 months, how many times have you moved where you were living?: 1    At any time in the past 12 months, were you homeless or living in a shelter (including now)?: No  Utilities: Not At Risk (12/24/2023)   Received from [facility]    In the past 12 months has the electric, gas, oil, or water company threatened to shut off services in your home?: No  Health Literacy: Not on file    Review of Systems:  All other review of systems negative except as mentioned in the HPI.  Physical Exam: Vital signs There were no vitals taken for this visit.  General:   Alert,  Well-developed, well-nourished, pleasant and cooperative in NAD Airway:  Mallampati  Lungs:  Clear throughout to auscultation.   Heart:  Regular rate and rhythm; no murmurs, clicks, rubs,  or gallops. Abdomen:  Soft, nontender and nondistended. Normal bowel sounds.   Neuro/Psych:  Normal mood and affect. A and O x 3  Inocente Hausen, MD [facility]

## 2024-04-25 ENCOUNTER — Ambulatory Visit (AMBULATORY_SURGERY_CENTER): Admitting: Pediatrics

## 2024-04-25 ENCOUNTER — Encounter: Payer: Self-pay | Admitting: Pediatrics

## 2024-04-25 VITALS — BP 110/57 | HR 80 | Temp 97.5°F | Resp 15 | Ht 60.0 in | Wt 148.0 lb

## 2024-04-25 DIAGNOSIS — D125 Benign neoplasm of sigmoid colon: Secondary | ICD-10-CM

## 2024-04-25 DIAGNOSIS — D509 Iron deficiency anemia, unspecified: Secondary | ICD-10-CM | POA: Diagnosis not present

## 2024-04-25 DIAGNOSIS — K449 Diaphragmatic hernia without obstruction or gangrene: Secondary | ICD-10-CM | POA: Diagnosis not present

## 2024-04-25 DIAGNOSIS — R197 Diarrhea, unspecified: Secondary | ICD-10-CM | POA: Diagnosis not present

## 2024-04-25 DIAGNOSIS — K5900 Constipation, unspecified: Secondary | ICD-10-CM

## 2024-04-25 DIAGNOSIS — K6389 Other specified diseases of intestine: Secondary | ICD-10-CM

## 2024-04-25 DIAGNOSIS — R1012 Left upper quadrant pain: Secondary | ICD-10-CM

## 2024-04-25 DIAGNOSIS — K2950 Unspecified chronic gastritis without bleeding: Secondary | ICD-10-CM | POA: Diagnosis present

## 2024-04-25 DIAGNOSIS — K219 Gastro-esophageal reflux disease without esophagitis: Secondary | ICD-10-CM

## 2024-04-25 DIAGNOSIS — Z903 Acquired absence of stomach [part of]: Secondary | ICD-10-CM

## 2024-04-25 DIAGNOSIS — R12 Heartburn: Secondary | ICD-10-CM

## 2024-04-25 MED ORDER — SODIUM CHLORIDE 0.9 % IV SOLN: 500.0000 mL | Freq: Once | INTRAVENOUS | Status: AC

## 2024-04-25 MED ORDER — LANSOPRAZOLE 30 MG PO CPDR: 30.0000 mg | DELAYED_RELEASE_CAPSULE | Freq: Every day | ORAL | 3 refills | Status: DC

## 2024-04-25 NOTE — Patient Instructions (Addendum)
 YOU HAD AN ENDOSCOPIC PROCEDURE TODAY AT THE [facility]:   Refer to the procedure report that was given to you for any specific questions about what was found during the examination.  If the procedure report does not answer your questions, please call your gastroenterologist to clarify.  If you requested that your care partner not be given the details of your procedure findings, then the procedure report has been included in a sealed envelope for you to review at your convenience later.  YOU SHOULD EXPECT: Some feelings of bloating in the abdomen. Passage of more gas than usual.  Walking can help get rid of the air that was put into your GI tract during the procedure and reduce the bloating. If you had a lower endoscopy (such as a colonoscopy or flexible sigmoidoscopy) you may notice spotting of blood in your stool or on the toilet paper. If you underwent a bowel prep for your procedure, you may not have a normal bowel movement for a few days.  Please Note:  You might notice some irritation and congestion in your nose or some drainage.  This is from the oxygen used during your procedure.  There is no need for concern and it should clear up in a day or so.  SYMPTOMS TO REPORT IMMEDIATELY:  Following lower endoscopy (colonoscopy or flexible sigmoidoscopy):  Excessive amounts of blood in the stool  Significant tenderness or worsening of abdominal pains  Swelling of the abdomen that is new, acute  Fever of 100F or higher  Following upper endoscopy (EGD)  Vomiting of blood or coffee ground material  New chest pain or pain under the shoulder blades  Painful or persistently difficult swallowing  New shortness of breath  Fever of 100F or higher  Black, tarry-looking stools  For urgent or emergent issues, a gastroenterologist can be reached at any hour by calling [phone]. Do not use MyChart messaging for urgent concerns.    DIET:  We do recommend a small meal at first, but  then you may proceed to your regular diet.  Drink plenty of fluids but you should avoid alcoholic beverages for 24 hours.  MEDICATIONS: Continue present medications. Lansoprazole  30 mg po daily (#30 with 3 refills).  FOLLOW UP: Await pathology results. If biopsies of cecal nodule are unrevealing, consider updated cross sectional imaging. Repeat colonoscopy for surveillance based on pathology results. Return to GI clinic in 4-8 weeks with Dr. Suzann or APP Oscar Coombs or [facility] May). Appointment made with Dr. Suzann Thursday, May 26, 2024 at 0850.  Educational handouts given to patient: Hiatal Hernia, polyps.  Thank you for allowing us  to provide for your healthcare needs today.  ACTIVITY:  You should plan to take it easy for the rest of today and you should NOT DRIVE or use heavy machinery until tomorrow (because of the sedation medicines used during the test).    FOLLOW UP: Our staff will call the number listed on your records the next business day following your procedure.  We will call around 7:15- 8:00 am to check on you and address any questions or concerns that you may have regarding the information given to you following your procedure. If we do not reach you, we will leave a message.     If any biopsies were taken you will be contacted by phone or by letter within the next 1-3 weeks.  Please call us  at [phone] if you have not heard about the biopsies in 3 weeks.  SIGNATURES/CONFIDENTIALITY: You and/or your care partner have signed paperwork which will be entered into your electronic medical record.  These signatures attest to the fact that that the information above on your After Visit Summary has been reviewed and is understood.  Full responsibility of the confidentiality of this discharge information lies with you and/or your care-partner.

## 2024-04-25 NOTE — Op Note (Signed)
  [facility] Patient Name: Alexis Hardin Procedure Date: 04/25/2024 8:32 AM MRN: [id number] Endoscopist: Inocente Hausen , MD, [id number] Age: 49 Referring MD:  Date of Birth: 18-Sep-1975 Gender: Female Account #: [id number] Procedure:                Colonoscopy Indications:              This is the patient's first colonoscopy, Abdominal                            pain in the left upper quadrant, Iron deficiency                            anemia, Constipation, Diarrhea, Dark stools Medicines:                Monitored Anesthesia Care Procedure:                Pre-Anesthesia Assessment:                           - Prior to the procedure, a History and Physical                            was performed, and patient medications and                            allergies were reviewed. The patient's tolerance of                            previous anesthesia was also reviewed. The risks                            and benefits of the procedure and the sedation                            options and risks were discussed with the patient.                            All questions were answered, and informed consent                            was obtained. Prior Anticoagulants: The patient has                            taken no anticoagulant or antiplatelet agents. ASA                            Grade Assessment: II - A patient with mild systemic                            disease. After reviewing the risks and benefits,                            the patient was deemed in satisfactory condition to  undergo the procedure.                           After obtaining informed consent, the colonoscope                            was passed under direct vision. Throughout the                            procedure, the patient's blood pressure, pulse, and                            oxygen saturations were monitored continuously. The                            CF [id number]  #[id number] was introduced through the anus                            and advanced to the terminal ileum. The colonoscopy                            was performed without difficulty. The patient                            tolerated the procedure well. The quality of the                            bowel preparation was good. The terminal ileum,                            ileocecal valve, appendiceal orifice, and rectum                            were photographed. Scope In: 9:42:20 AM Scope Out: 9:59:31 AM Scope Withdrawal Time: 0 hours 14 minutes 50 seconds  Total Procedure Duration: 0 hours 17 minutes 11 seconds  Findings:                 The perianal and digital rectal examinations were                            normal. Pertinent negatives include normal                            sphincter tone and no palpable rectal lesions.                           One 15 mm submucosal nodule vs. lipoma was found in                            the cecum. Biopsies were taken with a cold forceps                            for histology.  A 4 mm polyp was found in the sigmoid colon. The                            polyp was sessile. The polyp was removed with a                            cold biopsy forceps. Resection and retrieval were                            complete.                           Normal mucosa was found in the entire colon.                            Biopsies for histology were taken with a cold                            forceps for evaluation of microscopic colitis.                           The terminal ileum appeared normal.                           The retroflexed view of the distal rectum and anal                            verge was normal and showed no anal or rectal                            abnormalities. Complications:            No immediate complications. Estimated blood loss:                            Minimal. Estimated Blood Loss:      Estimated blood loss was minimal. Impression:               - Submucosal nodule in the cecum. Biopsied.                           - One 4 mm polyp in the sigmoid colon, removed with                            a cold biopsy forceps. Resected and retrieved.                           - Normal mucosa in the entire examined colon.                            Biopsied.                           - The examined portion of the ileum was normal.                           -  The distal rectum and anal verge are normal on                            retroflexion view. Recommendation:           - Discharge patient to home (ambulatory).                           - Await pathology results. If biopsies of cecal                            nodule are unrevealing, consider updated cross                            sectional imaging.                           - Repeat colonoscopy for surveillance based on                            pathology results.                           - The findings and recommendations were discussed                            with the patient's family.                           - Return to GI clinic in 4-8 weeks with Dr. Suzann                            or APP Oscar Coombs or [facility] May).                           - Patient has a contact number available for                            emergencies. The signs and symptoms of potential                            delayed complications were discussed with the                            patient. Return to normal activities tomorrow.                            Written discharge instructions were provided to the                            patient. Inocente Suzann, MD 04/25/2024 10:11:17 AM This report has been signed electronically.

## 2024-04-25 NOTE — Progress Notes (Signed)
 Transferred to PACU via stretcher.  Not responding to stimulation at this time.  VSS upon leaving procedure room.  [place], SCIENTIST, CLINICAL (HISTOCOMPATIBILITY AND IMMUNOGENETICS)

## 2024-04-25 NOTE — Op Note (Signed)
 [facility] Patient Name: Alexis Hardin Procedure Date: 04/25/2024 8:33 AM MRN: [id number] Endoscopist: Inocente Hausen , MD, [id number] Age: 49 Referring MD:  Date of Birth: 02/21/1976 Gender: Female Account #: [id number] Procedure:                Upper GI endoscopy Indications:              Abdominal pain in the left upper quadrant, Iron                            deficiency anemia, Heartburn, Follow-up of                            gastro-esophageal reflux disease, Diarrhea, History                            of gastric sleeve, Dark stools Medicines:                Monitored Anesthesia Care Procedure:                Pre-Anesthesia Assessment:                           - Prior to the procedure, a History and Physical                            was performed, and patient medications and                            allergies were reviewed. The patient's tolerance of                            previous anesthesia was also reviewed. The risks                            and benefits of the procedure and the sedation                            options and risks were discussed with the patient.                            All questions were answered, and informed consent                            was obtained. Prior Anticoagulants: The patient has                            taken no anticoagulant or antiplatelet agents. ASA                            Grade Assessment: II - A patient with mild systemic                            disease. After reviewing the risks and benefits,  the patient was deemed in satisfactory condition to                            undergo the procedure.                           After obtaining informed consent, the endoscope was                            passed under direct vision. Throughout the                            procedure, the patient's blood pressure, pulse, and                            oxygen saturations were  monitored continuously. The                            GIF [id number] #[id number] was introduced through the                            mouth, and advanced to the second part of duodenum.                            The upper GI endoscopy was accomplished without                            difficulty. The patient tolerated the procedure                            well. Scope In: Scope Out: Findings:                 The examined esophagus was normal. Biopsies were                            obtained from the proximal and distal esophagus                            with cold forceps for histology for evaluation of                            eosinophilic esophagitis.                           Evidence of a sleeve gastrectomy was found in the                            entire examined stomach. This was characterized by                            healthy appearing mucosa. Biopsies were taken with                            a cold forceps for Helicobacter pylori testing.  A small hiatal hernia was present.                           The duodenal bulb and second portion of the                            duodenum were normal. Biopsies for histology were                            taken with a cold forceps for evaluation of celiac                            disease. Complications:            No immediate complications. Estimated blood loss:                            Minimal. Estimated Blood Loss:     Estimated blood loss was minimal. Impression:               - Normal esophagus.                           - A sleeve gastrectomy was found, characterized by                            healthy appearing mucosa. Biopsied.                           - Small hiatal hernia.                           - Normal duodenal bulb and second portion of the                            duodenum. Biopsied.                           - Biopsies were taken with a cold forceps for                             evaluation of eosinophilic esophagitis. Recommendation:           - Await pathology results.                           - Perform a colonoscopy today.                           - The findings and recommendations were discussed                            with the patient's family. Inocente Hausen, MD 04/25/2024 10:02:51 AM This report has been signed electronically.

## 2024-04-25 NOTE — Progress Notes (Signed)
 Called to room to assist during endoscopic procedure.  Patient ID and intended procedure confirmed with present staff. Received instructions for my participation in the procedure from the performing physician.

## 2024-04-26 ENCOUNTER — Telehealth: Payer: Self-pay | Admitting: *Deleted

## 2024-04-26 NOTE — Telephone Encounter (Signed)
" °  Follow up Call-     04/25/2024    8:44 AM  Call back number  Post procedure Call Back phone  # [phone]  Permission to leave phone message Yes     Patient questions:  Do you have a fever, pain , or abdominal swelling? No. Pain Score  0 *  Have you tolerated food without any problems? Yes.    Have you been able to return to your normal activities? Yes.    Do you have any questions about your discharge instructions: Diet   No. Medications  No. Follow up visit  No.  Do you have questions or concerns about your Care? No.  Actions: * If pain score is 4 or above: No action needed, pain <4.   "

## 2024-04-26 NOTE — Progress Notes (Signed)
"   I have reviewed the participant's data from their visit including laboratory data for this study visit for the EXTEND 219230 study. "

## 2024-04-26 NOTE — Research (Signed)
 SABRA

## 2024-04-27 ENCOUNTER — Other Ambulatory Visit (HOSPITAL_COMMUNITY): Payer: Self-pay

## 2024-04-27 ENCOUNTER — Telehealth: Payer: Self-pay

## 2024-04-27 NOTE — Telephone Encounter (Signed)
 Lansoprazole  is not covered by patient's insurance unless there is documentation trial and fail BOTH omeprazole and pantoprazole.  Is it okay to change her medication?

## 2024-04-27 NOTE — Telephone Encounter (Signed)
 Pharmacy Patient Advocate Encounter   Received notification from CoverMyMeds that prior authorization for lansoprazole  (PREVACID ) 30 MG capsule  is required/requested.   Insurance verification completed.   The patient is insured through [employer].   Prior Authorization form/request asks a question that requires your assistance. Please see the question below and advise accordingly. The PA will not be submitted until the necessary information is received.   Patient is required to trial and fail BOTH omeprazole and pantoprazole. Submission without documentation of try/fail will result in a denial

## 2024-04-28 LAB — SURGICAL PATHOLOGY

## 2024-04-28 NOTE — Telephone Encounter (Signed)
Called the patient. No answer. Left message to return my call.

## 2024-04-28 NOTE — Telephone Encounter (Signed)
 Patient states she took Omeprazole of what she remembers being a high dose after gastric surgery in May of 2020 but it did not control her symptoms. She then was put on lansoprazole  and it has worked.

## 2024-05-01 ENCOUNTER — Ambulatory Visit: Payer: Self-pay | Admitting: Pediatrics

## 2024-05-02 ENCOUNTER — Other Ambulatory Visit: Payer: Self-pay

## 2024-05-02 DIAGNOSIS — R933 Abnormal findings on diagnostic imaging of other parts of digestive tract: Secondary | ICD-10-CM

## 2024-05-03 ENCOUNTER — Telehealth: Payer: Self-pay

## 2024-05-03 ENCOUNTER — Other Ambulatory Visit: Payer: Self-pay

## 2024-05-03 MED ORDER — PANTOPRAZOLE SODIUM 40 MG PO TBEC: 40.0000 mg | DELAYED_RELEASE_TABLET | Freq: Every day | ORAL | 1 refills | Status: DC

## 2024-05-03 MED ORDER — LINACLOTIDE 145 MCG PO CAPS: 145.0000 ug | ORAL_CAPSULE | Freq: Every day | ORAL | 3 refills | Status: AC

## 2024-05-03 NOTE — Telephone Encounter (Signed)
 Message sent to the patient. No answer on her telephone. Prescription sent to preferred pharmacy.

## 2024-05-03 NOTE — Telephone Encounter (Signed)
 Received through My Chart. Good afternoon, thank you for your call. I took the pills Linzess  as recommended but it was only a sample. I definitively noticed an improvement.  It helped but I will need to know the best time to take them to maximize the results.   Called the patient about her message. She is interested in Linzess  again. She is not interested in physical therapy at this time.  Alexis Hardin has the imaging scheduled.

## 2024-05-03 NOTE — Telephone Encounter (Signed)
 Patient is advised and agrees to this plan of care. No further questions at this time.

## 2024-05-03 NOTE — Telephone Encounter (Signed)
 Called patient x2. No answer. Left message of my call. Also established a line for communication through MyChart.  Elspeth, RN has spoken with the patient about the pathology and patient agreed to the MRI. That has been addressed.

## 2024-05-03 NOTE — Telephone Encounter (Signed)
 Patient reporting low back pain mid to right. Feels her constipation has worsened. Her stools are soft, not diarrhea. She does not feel emptied after the initial bowel movement of the day. She will then feel the urge to return to the bathroom later but is unable to produce stool. She has excessive intestinal gas. She gets some relief from Gas-X. She is not taking oral iron supplement. Worsened her lower GI issues. Complains of fatigue.

## 2024-05-06 ENCOUNTER — Ambulatory Visit (HOSPITAL_COMMUNITY): Admission: RE | Admit: 2024-05-06

## 2024-05-06 ENCOUNTER — Ambulatory Visit (HOSPITAL_COMMUNITY)

## 2024-05-10 ENCOUNTER — Other Ambulatory Visit: Payer: Self-pay

## 2024-05-10 DIAGNOSIS — Z006 Encounter for examination for normal comparison and control in clinical research program: Secondary | ICD-10-CM

## 2024-05-10 NOTE — Research (Addendum)
 EXTEND [id number]                                                  Protocol Amendment 03  [facility] Research Site:  [id number]                                                                  TREATMENT PHASE  Date: 20JAN2026         4 Letter Code: MELO Subject ID: [id number]  Visit:   [] M10    [x] M11    [] M12    [] M13   [] M13+1W    [] M15    [] M17    [] M17+1W     [] M19    [] M21   [] M23    [] M25   [] M27    [] M29    [] M31   [] M33   [] ED    [] 4 wk followup (if not entering LTFU)   [] LTFU M1   [] LTFU M4    [] LTFU M8    [] LTFU M12     Assessments should occur in the following order:  eCSSRs  All other questionnaires  Triplicate 12-lead ECG  Vital signs  Blood draws (clinical laboratory tests, pre-dose pharmacokinetics, etc)  IP injection  Post-dose pharmacokinetics (if required)  Yes No Completed:  [x]  []  Verify participant identity before doing any study procedures   All Visits  [x]  []  Verify correct version of ICF is signed   All Visits   Yes No Questionnaires Completed: Patient Reported Outcome Assessment must be completed before other assessments take place at each designated visit. Completed electronically by participants.  []  [x]  CSSRs    Visits: M13, M17, M21, M25, M29, M33, ED Any positive (abnormal) response indicating SIB or any unusual changes in behavior, confirmed by the investigator will result in their discontinuation of study intervention, the PI/SI will arrange for urgent specialist psychiatric evaluation and management  []  [x]  Social Determinants of Health Questionnaire (SDoH)     Visits: M17, ED  []  [x]  Study Medication Satisfaction Questionnaire (SMSQs)     Visits: M13, M17, M21, M25, M29, M33, ED  []  [x]  PrEP Preference and Rationale Questionnaire (only if prior exposure to oral PrEP)     Visits: M17, M25, M33, ED  []  [x]  Acceptability of Injection Questionnaire     Visits: M13, M17, M21, M25, M29, M33, ED  []  [x]  Generalized Anxiety Disorders (GAD-&) Questionnaire     Visits: M13, M17,  M21, M25, M29, M33, ED  []  [x]  HIV-risk and PrEP use Anxiety and Stigma Questionnaire prior to the study drug administration     Visits: M17, M29, M33, ED  []  [x]  Sexual Health Practices Questionnaire-administer prior to study drug administration Visits: M13, M17, M21, M25, M29, M33, ED    Yes No Completed:  [x]  []  Review Changes in Concomitant Medications    All Visits  [x]  []  Assess for AEs, SAEs: All AEs (from Day 1 onwards) and SAEs (from Screening onwards) must be recorded in eCRF at all visits and reported within required timelines.    All Visits  Is participant experiencing any symptomatic STIs currently?  [] Yes [x] No (  if yes, perform STI testing)  Is participant experiencing any signs or symptoms consistent with acute HIV infection?  [] Yes [x] No (if yes, perform HIV testing)   [x]  []  Pull Review in:  Study: EXTEND4M Subj ID: [id number] Letter Code: MELO  Medication Review Medication Indication Dose/Route/Frequency Start Date End Date  Magnesium Glycinate Prophylaxis 50mg  PO QD 01Dec2024 31MAR2025  Nicotinamide Riboside Chloride Powder Prophylaxis 1 Capsule QD 01Dec2024 31MAR2025  Resveratrol Prophylaxis 1 Capsule QD 01Dec2024 31MAR2025  Cephalexin Urinary Tract Infection 500 mg tab QD 06Mar2025 11Mar2025  Amoxicillin   Urinary Tract Infection 875 mg tab BID 12Mar2025 22Mar2025  phenazopyridine  (PYRIDIUM )  Urinary Tract Infection 200 MG tablet TID 12Mar2025 17Mar2025  phenazopyridine  (PYRIDIUM )  Urinary Tract Infection 200 MG tablet TID 28mar2025 09APR2025  Ciprofloxacin  (Cipro ) Urinary tract infection 500 mg PO BID 28MAR2025 02APR2025  nitrofurantoin , macrocrystal-monohydrate, (MACROBID )  Urinary Tract Infection 100 mg PO BID  02APR2025 09APR2025  famotidine  (PEPCID )  nausea 20 mg tab PO BID 29MAR2025 09APR2025  iohexol  (OMNIPAQUE ) 9 MG/ML Oral contrast Abdominal pain  oral solution 500 mL once 29MAR2025 29MAR2025  alum & mag hydroxide-simeth (MAALOX/MYLANTA)  200-200-20 MG/5ML   nausea Oral suspension 30 mL once 29MAR2025 29MAR2025  lidocaine  (XYLOCAINE ) 2 %  nausea 15 mL PO once 29MAR2025 29MAR2025  lactated ringers  bolus 1,000 mL  lightheadedness 1,000 mL Intravenous once 29MAR2025 29MAR2025  ondansetron  (ZOFRAN )   nausea 4 mg IV once  29MAR2025 29MAR2025  ketorolac  (TORADOL )  Abdominal pain 15 mg Intravenous once 29MAR2025 29MAR2025  iohexol  (OMNIPAQUE ) 300 MG/ML Intravenous Contrast  Abdominal pain  80 mL IV once 29MAR2025 29MAR2025  ibuprofen Right ventrogluteal injection site tenderness 200mg  tab PO Q8 hours 02JUN2025 09JUN2025  metroNIDAZOLE (FLAGYL)  diarrhea 500mg  tab PO TID 04SEP2025 14SEP2025  Iron anemia 65mg  tab PO QD 04SEP2025 06SEP2025  Vitamin C anemia 125mg  tab PO QD 04SEP2025 06SEP2025  metroNIDAZOLE (FLAGYL)  diarrhea 500mg  tab PO TID 26SEP2025 06OCT2025  Magnesium citrate Colonoscopy/ IBS 1.75g solution PO QD 04JAN2026 05JAN2026  Linzess  IBS 145mg  capsule PO QD 13JAN2026   Foodology Bowel Movement Jelly IBS 1 jelly/ tablet PO QD 13JAN2026   Saline Colonoscopy/ IBS 500mL IV once 05JAN2026 05JAN2026   AE Review Diagnosis/Symptoms Indicate: NSAE SAE AESI Maximum Grade Related/Not Related to study intervention CAB LA or CAB ULA Action/Dose Change Start Date (Time, if available) End Date (Time, if available)  Right Ventrogluteal Injection Site Tenderness NSAE  1 Related to CAB LA No Change, No action taken 28Feb2025 02Mar2025  Urinary Tract Infection NSAE 2 Not Related to study intervention No change, no action taken 04Mar2025 22Mar2025  Urinary tract infection NSAE 2 Not related to study intervention No change, no action taken 28MAR2025 09APR2025  Abdominal pain NSAE 2 Not related to study intervention No change, no action taken 29MAR2025 09APR2025  Nausea NSAE 2 Not related to study intervention No change, no action taken 29MAR2025 09APR2025  Vomiting NSAE 2 Not related to study intervention No change, no action taken 29MAR2025 09APR2025   Lightheadedness NSAE 2 Not related to study intervention No change, no action taken 29MAR2025 30MAR2025  Hiatal hernia NSAE 2 Not related to study intervention No change, no action taken 29MAR2025   Uterine leiomyoma, unspecified location  NSAE 2 Not related to study intervention No change, no action taken 29MAR2025   Right ventrogluteal injection site tenderness NSAE 1 Related to CAB ULA No change, no action 02JUN2025 09JUN2025  Viral Diarrhea NSAE 2 Not related to study intervention No change, no action 01SEP2025 05SEP2025  Viral Diarrhea  NSAE 2 Not related to study intervention No change, No action 24SEP2025 06OCT2025  Increased creatinine , grade 3 NSAE 3 Not related to Study intervention No change, no action 29Sep2025 27Oct2025   Medical History Review  Diagnosis Grade Start Date End Date  Bradycardia, asymptomatic 1 Unk 2025   Irritable Bowel Syndrome 2 Unk 2023   Intermittent Low Back Pain 2 05Aug2022   Conjunctivitis of Left Eye 2 08Jul2023 17Jul2023  Urinary Tract Infection 2 12Jan2024 17Jan2024  Anemia 2 17Jun2024      [x]  []  Contraception Review: Report in eCRF Visits: M10, M11, M13, M17, M21, M25, M29, M33, ED, 4WFU, LTFU Visits Female of childbearing potential: Must agree to use a highly effective method of contraception consistently and correctly, must also continue to use adequate contraception methods for at least 52 weeks after the last injection. Female condoms must be used in addition to hormonal contraception Sexual Abstinence: considered a highly effective method only if defined as refraining from heterosexual intercourse during the entire period of risk associated with the study intervention.  The reliability of sexual abstinence needs to be evaluated in relation to the duration of the study and the preferred and usual lifestyle of the participant.  [] NA, assigned Female at birth or Not a POCBP   Yes No/NA Completed:  []  [x]  Review Changes in Medical History:   Visits:  M13, M21, M29, M33  Premature cardiovascular disease: female participant <65 years or female participant <55 years in first degree relatives only.  Note changes since the start of the study.    [] No Changes    Substance Usage: (list substance, amount used, start and stop dates)  Social History   Substance and Sexual Activity  Alcohol Use Not Currently   Alcohol/week: 1.0 standard drink of alcohol   Types: 1 Glasses of wine per week    Social History   Substance and Sexual Activity  Drug Use Not Currently   Types: Marijuana   Comment: + on urine screen    Tobacco Use: Low Risk (04/25/2024)   Patient History    Smoking Tobacco Use: Never    Smokeless Tobacco Use: Never    Passive Exposure: Never    Caffeine Use:  caffeine containing beverages/day  []  [x]  Triplicate 12-lead ECG     Visits: M29, ED Perform in a semi-supine position after 5 minutes of rest.  3 individual ECG tracing should be obtained as closely as possible in succession, but no more than 2 minutes apart within 1 hour prior to dosing. The full set of triplicates should be completed in less than 6 minutes. If ECG abnormal clinically significant  then report as an AE/SAE.  Rest Start Time:  ECG #1 start time:     ECG #2:     ECG #3:    []  [x]  Vital Signs:  [] Weight, BMI (measured in a semi supine position after 5 minutes rest)  Visits:  M13, M17, M21, M25, M29, M33, ED, 4WFU, LTFU Visits  [] BP and Pulse (measured in a semi supine position after 5 minutes rest)  Visits: M13, M17, M21, M25, M29, M33, ED, 4WFU,  LTFU Visits  [] Temperature and RR:    Visits: M13, ED  Rest Start Time:   Vital Signs Start Time:     There is no height or weight on file to calculate BMI.  There were no vitals filed for this visit.   [x]  []  Brief Physical Examination: Will include at a minimum, assessments of skin, lungs, cardiovascular system,  abdomen (liver and spleen). Perform the symptom directed exam when there are symptoms present. In  addition to the brief physical exam. Any abnormalities must be noted in the CRF (e.g current medical conditions or AE logs).     Visit: M10, M11, M13, M17, M21, M25, M29, M33, ED, 4WFU, LTFU   Physical Exam Constitutional:      Appearance: Normal appearance.  Cardiovascular:     Rate and Rhythm: Normal rate and regular rhythm.  Pulmonary:     Effort: Pulmonary effort is normal.     Breath sounds: Normal breath sounds.  Abdominal:     General: Bowel sounds are normal.     Palpations: Abdomen is soft.     Tenderness: There is no abdominal tenderness.  Skin:    General: Skin is warm and dry.  Neurological:     General: No focal deficit present.     Mental Status: She is alert and oriented to person, place, and time. Mental status is at baseline.  Psychiatric:        Mood and Affect: Mood normal.        Behavior: Behavior normal.        Thought Content: Thought content normal.        Judgment: Judgment normal.       Yes No Blood Draw Tracking:  All Visits  [x]  []  Verified previous Hgb to be >= 10 (if value <10, adjust volume per unit SOP)                          [] NA, healthy volunteer, previous Hgb results are unavailable  [x]  []  Assess Fasting Status.   Visits: M13, M33                                                      Is participant Fasting for at least 6 hours, overnight fast preferred?  [x]  No   []  Yes, Date/Time of last meal?   [x]  []  Blood Drawn.     Problems?  [x]  No   [] Yes, why?   Time: 0903    Total Vol: 18.5 mL   Yes No Lab Collection:  []  [x]  RAPID HIV TESTING        Visits: M13, M17, M21, M25, M29, ED, 4WFU, LTFU  [] REACTIVE or [] NON REACTIVE  If positive/reactive participant must be withdrawn from the study even if subsequent confirmatory testing is negative. If reactive participant referred for further testing and clinical management as per local standard of care   [x]  []  Chemistry     Visits: M10, M11, M13, M17, M21, M25, M29, M33, ED, 4WFU, LTFU  [x]  []   Hematology     Visits: M10, M11, M13, M17, M21, M25, M29, M33, ED, 4WFU, LTFU  [x]  []  Cystatin C     Visits:  M10, M11, M13, M17, M21, M25, M29, M33, ED, 4WFU, LTFU  []  [x]  Non rapid HIV immunoassay    Visits: M13, M17, M21, M25, M29, M33, ED, 4WFU, LTFU  []  [x]  HIV-1 RNA     Visits: M13, M17, M21, M25, M29, M33, ED, 4WFU, LTFU  []  [x]  Neisseria gonorrhea (GC), Chlamydia trachomatis (CT), Trichomononas vaginalis (TV), syphilis.  If positive we will refer for treatment as per local guidelines.   Visit: M13, M17, M21, M25, M29,  M33, ED, 4WFU, LTFU, PRN   **PRN if symptomatic at other visits** Appropriate swabs to be collected based on reported sexual history.  [] GC/CT NAAT urine/vaginal swab [] GC and CT: Rectal Swab [] Oropharyngeal Swab [] TV: vaginal Swab   *If female and menstruating may take sample at next visit  []  [x]  Coagulation Tests     Visits: M13, M33, ED  []  [x]  Hepatitis B&C     Visits: M13, M25, ED  []  [x]  Fasting Labs: Glucose     Visits: M13, M33, ED (Insulin, HbA1c only collect if withdrawal visit occurs at M13 or M33)  [x]  []  Plasma for Storage     Visits: M10, M11, M13, M17, M21, M25, M29, ED  []  [x]  Predose: PK Sampling  Collect  < 1 hr before injection Visits: M13, M17, M21, M25, M29  []  [x]  Illicit Drug Screen     Visits: M13, M21, M29, M33  []  [x]  Urinalysis: Morning specimen is preferred     Visits: M13, M33, ED, 4WFU   Time of Collection:      Yes No/NA Lab Collection, Cont.:  []  [x]  Urine Pregnancy Test (POCBP only)  Visits:  M13, M17, M21, M25, M29, M33 , ED, 4WFU, LTFU       Note: Must be resulted prior to receipt of IP  LMP: No LMP recorded.    Time of Collection:     [] REACTIVE or [] NON REACTIVE  [] NA, assigned Female at birth or Not a POCBP   []                          [x]  Serum Pregnancy Test (POCBP only) if urine test positive, perform a serum test to confirm.   Yes No/NA Study Drug Administration  []  [x]  CAB ULA Administration:   Visit: M13, M17,  M21, M25, M29  Sites should be rotated between right and left Gauge: 21G   Needle Size: [] 1.5  [] 2 (For BMI >/= 30)   There is no height or weight on file to calculate BMI.  Site: [] Left Ventrogluteal  [] Right Ventrogluteal  Time of Administration:    [x]  []  ISR Review completed:    All Visits                         ISR photography necessary: [] Yes[x] No      *Digital photographs will be documented at all visits (scheduled or unscheduled) on all participants who have an injection site reaction that is a visible, persistent Grade 2 (no improvement/resolution in >10 days), Grade >/=3, or serious. *Examination will include a physician/HCP assessment of pain (or tenderness), pruritus, warmth, infections, rash, erythema (or redness), swelling (or induration), and nodules (granulomas or cysts) *For injection site nodules, the largest diameter will be recorded in the eCRF at each ISR assessment for nodules of at least Grade 1 severity  Remote Visits: M12, M15, M19, M23, M27, M31 should assess whether participants have any new ISR symptoms/signs since their last visit or have previously reported ISR that has not improved or is worsening, if present site should perform an unscheduled visits ASAP for ISR assessment If ISR during remote phone call visit, schedule ISR assessment.  Date:    Yes No/NA  Pharmacokinetic Sampling  [x]  []   Post Dose PK sampling:  Visits: M10, M11, M13, M13+1, M17+1, M33, ED, 4WFU, LTFU   On dosing days collect PK 2 hours after injection (+/- 30 mins collection window)  Time of  PK Collection: 0903    Yes No Completed:  [x]  []   HIV/STI  Counseling/Condom Distribution:    All Visits, except remote calls Condoms: [] Accepted  [x] Declined   Yes No/NA Completed  [x]  []  Assess for Protocol Deviations  [x]  []  Reminders:  Participants of childbearing potential agree to use a highly effective method of contraception consistently and correctly HIV vaccines are not permitted at  any time during the study No other experimental agents, antiretroviral drugs, cytotoxic chemotherapy, or radiation therapy may not be administered throughout the trial No systemically administered immunomodulator's permitted Notify study staff if you experience any signs/symptoms, visit provider in ER or scheduled evaluation, or start any new OTC, supplemental or prescription therapies Instructed to contact site if ISR gets worse or does not improve after 10 days from the last ISR assessment.   Yes No Completed:  [x]  []  Compensation:  You will receive a payment of $78.00 at the end of each non dosing visit completed to reimburse you for your time and effort.  You will receive $125.00 at the end of each dosing visit completed to reimburse you for your time and effort.   If you must return to the clinic for an unscheduled visit, you will receive $78.  Phone visits will be reimbursed at $30.00.  Amount: $78.00   [x]  []  Schedule Next Visit: Visits should be planned using a calendar months and scheduled based on the date of the Day 1 injection. Stay as close to the original date as possible.   Remote Call Visits schedule on M12, M15, M19, M23, M27, M31 (Assess ISR, AES, Conmeds)  If transitioning to CAB 200mg /ml do not need LTFU visits, but do need ED visit and 4 week followup visit.  Date: 27FEB2026     Time: 0900   Version 3.4 LOA03 [id number]

## 2024-05-12 NOTE — Research (Signed)
 SABRA

## 2024-05-12 NOTE — Progress Notes (Signed)
"   I have reviewed the participant's data from their visit including laboratory data for this study visit for the EXTEND  study.  "

## 2024-05-17 ENCOUNTER — Telehealth: Payer: Self-pay

## 2024-05-17 ENCOUNTER — Other Ambulatory Visit (HOSPITAL_COMMUNITY): Payer: Self-pay

## 2024-05-17 NOTE — Telephone Encounter (Signed)
 Pharmacy Patient Advocate Encounter   Received notification from [facility] that prior authorization for Linzess  capsules is required/requested.   Insurance verification completed.   The patient is insured through [facility] [employer].   Per test claim: PA required; PA submitted to above mentioned insurance via Latent Key/confirmation #/EOC [id number] Status is pending

## 2024-05-19 ENCOUNTER — Other Ambulatory Visit (HOSPITAL_COMMUNITY): Payer: Self-pay

## 2024-05-19 NOTE — Telephone Encounter (Signed)
 My chart message sent to pt.

## 2024-05-19 NOTE — Telephone Encounter (Signed)
 Pharmacy Patient Advocate Encounter  Received notification from [facility] [employer] that Prior Authorization for Linzess  capsules has been APPROVED from 05-17-2024 to 11-14-2024. Ran test claim, Copay is $773.89. This test claim was processed through [facility]- copay amounts may vary at other pharmacies due to pharmacy/plan contracts, or as the patient moves through the different stages of their insurance plan.   PA #/Case ID/Reference #: [id number]

## 2024-05-26 ENCOUNTER — Other Ambulatory Visit (INDEPENDENT_AMBULATORY_CARE_PROVIDER_SITE_OTHER)

## 2024-05-26 ENCOUNTER — Encounter: Payer: Self-pay | Admitting: Pediatrics

## 2024-05-26 ENCOUNTER — Ambulatory Visit: Admitting: Pediatrics

## 2024-05-26 VITALS — BP 112/72 | HR 100 | Ht 60.0 in | Wt 133.0 lb

## 2024-05-26 DIAGNOSIS — R109 Unspecified abdominal pain: Secondary | ICD-10-CM | POA: Diagnosis not present

## 2024-05-26 DIAGNOSIS — K5900 Constipation, unspecified: Secondary | ICD-10-CM

## 2024-05-26 DIAGNOSIS — K639 Disease of intestine, unspecified: Secondary | ICD-10-CM

## 2024-05-26 DIAGNOSIS — K219 Gastro-esophageal reflux disease without esophagitis: Secondary | ICD-10-CM | POA: Diagnosis not present

## 2024-05-26 DIAGNOSIS — Z9884 Bariatric surgery status: Secondary | ICD-10-CM

## 2024-05-26 DIAGNOSIS — D649 Anemia, unspecified: Secondary | ICD-10-CM

## 2024-05-26 DIAGNOSIS — K449 Diaphragmatic hernia without obstruction or gangrene: Secondary | ICD-10-CM

## 2024-05-26 DIAGNOSIS — K760 Fatty (change of) liver, not elsewhere classified: Secondary | ICD-10-CM | POA: Diagnosis not present

## 2024-05-26 LAB — CBC
HCT: 33.3 % — ABNORMAL LOW (ref 36.0–46.0)
Hemoglobin: 10.6 g/dL — ABNORMAL LOW (ref 12.0–15.0)
MCHC: 31.9 g/dL (ref 30.0–36.0)
MCV: 65.6 fl — ABNORMAL LOW (ref 78.0–100.0)
Platelets: 308 10*3/uL (ref 150.0–400.0)
RBC: 5.07 Mil/uL (ref 3.87–5.11)
RDW: 17.5 % — ABNORMAL HIGH (ref 11.5–15.5)
WBC: 5.4 10*3/uL (ref 4.0–10.5)

## 2024-05-26 LAB — IBC + FERRITIN
Ferritin: 3.2 ng/mL — ABNORMAL LOW (ref 10.0–291.0)
Iron: 13 ug/dL — ABNORMAL LOW (ref 42–145)
Saturation Ratios: 2.7 % — ABNORMAL LOW (ref 20.0–50.0)
TIBC: 481.6 ug/dL — ABNORMAL HIGH (ref 250.0–450.0)
Transferrin: 344 mg/dL (ref 212.0–360.0)

## 2024-05-26 MED ORDER — FAMOTIDINE 20 MG PO TABS: 20.0000 mg | ORAL_TABLET | Freq: Two times a day (BID) | ORAL | 2 refills | Status: AC

## 2024-05-26 NOTE — Patient Instructions (Addendum)
 Your provider has requested that you go to the basement level for lab work before leaving today. Press B on the elevator. The lab is located at the first door on the left as you exit the elevator.  Due to recent changes in healthcare laws, you may see the results of your imaging and laboratory studies on MyChart before your provider has had a chance to review them.  We understand that in some cases there may be results that are confusing or concerning to you. Not all laboratory results come back in the same time frame and the provider may be waiting for multiple results in order to interpret others.  Please give us  48 hours in order for your provider to thoroughly review all the results before contacting the office for clarification of your results.   Please purchase the following medications over the counter and take as directed: Miralax - 1 capful daily   We have sent the following medications to your pharmacy for you to pick up at your convenience: Famotidine    Follow-up in 6 month. Office to contact you with an appointment.   _______________________________________________________  If your blood pressure at your visit was 140/90 or greater, please contact your primary care physician to follow up on this.  _______________________________________________________  If you are age 9 or older, your body mass index should be between 23-30. Your Body mass index is 25.97 kg/m. If this is out of the aforementioned range listed, please consider follow up with your Primary Care Provider.  If you are age 4 or younger, your body mass index should be between 19-25. Your Body mass index is 25.97 kg/m. If this is out of the aformentioned range listed, please consider follow up with your Primary Care Provider.   ________________________________________________________  The [facility] providers would like to encourage you to use MYCHART to communicate with providers for non-urgent requests or  questions.  Due to long hold times on the telephone, sending your provider a message by [facility] may be a faster and more efficient way to get a response.  Please allow 48 business hours for a response.  Please remember that this is for non-urgent requests.  _______________________________________________________  [facility] is using a team-based approach to care.  Your team is made up of your doctor and two to three APPS. Our APPS (Nurse Practitioners and Physician Assistants) work with your physician to ensure care continuity for you. They are fully qualified to address your health concerns and develop a treatment plan. They communicate directly with your gastroenterologist to care for you. Seeing the Advanced Practice Practitioners on your physician's team can help you by facilitating care more promptly, often allowing for earlier appointments, access to diagnostic testing, procedures, and other specialty referrals.   Thank you for choosing me and [facility].  Inocente Hausen, MD

## 2024-05-27 ENCOUNTER — Ambulatory Visit: Payer: Self-pay | Admitting: Pediatrics

## 2024-06-09 ENCOUNTER — Encounter: Admitting: Physical Therapy

## 2024-06-16 ENCOUNTER — Encounter: Admitting: Physical Therapy

## 2024-06-23 ENCOUNTER — Encounter: Admitting: Physical Therapy

## 2024-06-30 ENCOUNTER — Encounter: Admitting: Physical Therapy

## 2024-07-07 ENCOUNTER — Encounter: Admitting: Physical Therapy
